# Patient Record
Sex: Female | Born: 1965 | Race: White | Hispanic: No | Marital: Married | Smoking: Never smoker
Health system: Southern US, Community
[De-identification: ages and names within clinical notes are randomized; demographics above are authoritative.]

## PROBLEM LIST (undated history)

## (undated) DIAGNOSIS — F419 Anxiety disorder, unspecified: Secondary | ICD-10-CM

## (undated) DIAGNOSIS — T7840XA Allergy, unspecified, initial encounter: Secondary | ICD-10-CM

## (undated) DIAGNOSIS — E079 Disorder of thyroid, unspecified: Secondary | ICD-10-CM

## (undated) HISTORY — DX: Anxiety disorder, unspecified: F41.9

## (undated) HISTORY — DX: Disorder of thyroid, unspecified: E07.9

## (undated) HISTORY — DX: Allergy, unspecified, initial encounter: T78.40XA

## (undated) HISTORY — PX: BREAST SURGERY: SHX581

## (undated) HISTORY — PX: OVARIAN CYST REMOVAL: SHX89

---
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
## 2014-03-26 DIAGNOSIS — J302 Other seasonal allergic rhinitis: Secondary | ICD-10-CM | POA: Insufficient documentation

## 2014-03-26 DIAGNOSIS — N959 Unspecified menopausal and perimenopausal disorder: Secondary | ICD-10-CM | POA: Insufficient documentation

## 2014-03-26 DIAGNOSIS — E039 Hypothyroidism, unspecified: Secondary | ICD-10-CM | POA: Insufficient documentation

## 2016-07-27 ENCOUNTER — Other Ambulatory Visit: Payer: Self-pay | Admitting: Family Medicine

## 2016-07-27 DIAGNOSIS — Z1231 Encounter for screening mammogram for malignant neoplasm of breast: Secondary | ICD-10-CM

## 2017-08-29 ENCOUNTER — Encounter: Payer: Self-pay | Admitting: Family Medicine

## 2017-08-29 ENCOUNTER — Ambulatory Visit: Payer: Self-pay | Admitting: Family Medicine

## 2017-08-29 ENCOUNTER — Other Ambulatory Visit: Payer: Self-pay

## 2017-08-29 VITALS — BP 120/80 | HR 76 | Temp 98.2°F | Resp 14 | Ht 65.75 in | Wt 156.4 lb

## 2017-08-29 DIAGNOSIS — F430 Acute stress reaction: Secondary | ICD-10-CM

## 2017-08-29 DIAGNOSIS — E039 Hypothyroidism, unspecified: Secondary | ICD-10-CM

## 2017-08-29 LAB — TSH: TSH: 0.11 u[IU]/mL — AB (ref 0.35–4.50)

## 2017-08-29 MED ORDER — ESCITALOPRAM OXALATE 10 MG PO TABS: 10.0000 mg | ORAL_TABLET | Freq: Every day | ORAL | 2 refills | Status: DC

## 2017-08-29 NOTE — Patient Instructions (Addendum)
Please return in 1-6 months for your complete physical with your pap smear.  It was a pleasure meeting you today! Thank you for choosing us to meet your healthcare needs! I truly look forward to working with you. If you have any questions or concerns, please send me a message via Mychart or call the office at [phone].  Consider counseling.   Depression Medications:  Taking the medicine as directed and not missing any doses is one of the best things you can do to treat your depression.  Here are some things to keep in mind:  1) Side effects (stomach upset, some increased anxiety) may happen before you notice a benefit.  These side effects typically go away over time. 2) Changes to your dose of medicine or a change in medication all together is sometimes necessary 3) Most people need to be on medication at least 6-12 months 4) Many people will notice an improvement within two weeks but the full effect of the medication can take up to 4-6 weeks 5) Stopping the medication when you start feeling better often results in a return of symptoms 6) If you start having thoughts of hurting yourself or others after starting this medicine, please call the office immediately at [phone].

## 2017-08-29 NOTE — Progress Notes (Signed)
Subjective  CC:  Chief Complaint  Patient presents with  . Establish Care    She is overdue for her Thyroid medication and labs    HPI: Jill Ferguson is a 52 y.o. female who presents to [facility] today to establish care with me as a new patient. She is a former [facility] patient and is here to reestablish care with me today.    She has the following concerns or needs:   52 year old with history of hypothyroidism overdue for thyroid check.  I reviewed labs from 2018 and prior.  We have been struggling to get her TSH goal.  She has been alternating 88 mcg with 100 mcg up until November but TSH was not checked on that level.  Since November she has been taking 100 mcg daily.  Unfortunately, she has a lot of stressors in her life.  She has 3 children, 2 of which are her biologic grandchildren for which she has custody.  A 52 year old is struggling with several issues.  She does have a daughter, their mother, who is incarcerated due to substance abuse.  She reports that she has trouble getting out of bed in the morning.  Does not want to face the day.  Like to go back to sleep.  Poor sleep with getting up several times a night to check on her kids.  Increase irritability, increased apathy, no longer having fun or socializing with others.  No history of depression or other mood disorders.  She is never been treated on medicines.  She does have perimenopausal symptoms that she thinks is contributing to her mood symptoms.  No suicidal ideations.  Depression screen [facility] 2/9 08/29/2017  Decreased Interest 0  Down, Depressed, Hopeless 0  PHQ - 2 Score 0  Altered sleeping 0  Tired, decreased energy 0  Change in appetite 0  Feeling bad or failure about yourself  0  Trouble concentrating 0  Moving slowly or fidgety/restless 0  Suicidal thoughts 0  PHQ-9 Score 0    We updated and reviewed the patient's past history in detail and it is documented below.  Patient Active  Problem List   Diagnosis Date Noted  . Acquired hypothyroidism 03/26/2014  . Postmenopausal symptoms 03/26/2014  . Seasonal allergies 03/26/2014   Health Maintenance  Topic Date Due  . MAMMOGRAM  04/03/2014  . COLONOSCOPY  08/01/2014  . PAP SMEAR  04/17/2016  . INFLUENZA VACCINE  05/01/2018 (Originally 02/01/2017)  . TETANUS/TDAP  08/29/2018 (Originally 08/02/1983)  . HIV Screening  08/29/2018 (Originally 08/02/1979)    There is no immunization history on file for this patient. Current Meds  Medication Sig  . cetirizine (ZYRTEC) 10 MG tablet Take 10 mg by mouth as needed for allergies.  Marland Kitchen levothyroxine (SYNTHROID, LEVOTHROID) 100 MCG tablet Take 1 tablet alternating with 88 mcg tablet once daily. Has been taking only 100 mcg the past month    Allergies: Patient has No Known Allergies. Past Medical History Patient  has a past medical history of Allergy, Anxiety, and Thyroid disease. Past Surgical History Patient  has a past surgical history that includes Cesarean section; Ovarian cyst removal; and Breast surgery. Family History: Patient family history includes Drug abuse in her daughter; Healthy in her son. She was adopted. Social History:  Patient  reports that  has never smoked. she has never used smokeless tobacco. She reports that she drinks alcohol. She reports that she does not use drugs.  Review of Systems: Constitutional: negative  for fever or malaise Ophthalmic: negative for photophobia, double vision or loss of vision Cardiovascular: negative for chest pain, dyspnea on exertion, or new LE swelling Respiratory: negative for SOB or persistent cough Gastrointestinal: negative for abdominal pain, change in bowel habits or melena Genitourinary: negative for dysuria or gross hematuria Musculoskeletal: negative for new gait disturbance or muscular weakness Integumentary: negative for new or persistent rashes Neurological: negative for TIA or stroke symptoms Psychiatric:  negative for SI or delusions Allergic/Immunologic: negative for hives  Patient Care Team    Relationship Specialty Notifications Start End  Willow OraAndy, Camille L, MD PCP - General Family Medicine  08/29/17     Objective  Vitals: BP 120/80   Pulse 76   Temp 98.2 F (36.8 C) (Oral)   Resp 14   Ht 5' 5.75" (1.67 m)   Wt 156 lb 6.4 oz (70.9 kg)   SpO2 97%   BMI 25.44 kg/m  General:  Well developed, well nourished, no acute distress  Psych:  Alert and oriented,normal mood and affect HEENT:  Normocephalic, atraumatic, non-icteric sclera, PERRL, oropharynx is without mass or exudate, supple neck without adenopathy, mass or thyromegaly Cardiovascular:  RRR without gallop, rub or murmur, nondisplaced PMI Respiratory:  Good breath sounds bilaterally, CTAB with normal respiratory effort Neurologic:    Mental status is normal.  No tremor Assessment  1. Acquired hypothyroidism   2. Stress reaction      Plan   Recheck thyroid levels and adjust medications as needed.  Stress reaction/depression: Extensive counseling and education given.  Recommend seeing a therapist and starting Lexapro 10 daily recheck 6 weeks  Follow up: 6 weeks for follow-up  Commons side effects, risks, benefits, and alternatives for medications and treatment plan prescribed today were discussed, and the patient expressed understanding of the given instructions. Patient is instructed to call or message via MyChart if he/she has any questions or concerns regarding our treatment plan. No barriers to understanding were identified. We discussed Red Flag symptoms and signs in detail. Patient expressed understanding regarding what to do in case of urgent or emergency type symptoms.   Medication list was reconciled, printed and provided to the patient in AVS. Patient instructions and summary information was reviewed with the patient as documented in the AVS. This note was prepared with assistance of Dragon voice recognition software.  Occasional wrong-word or sound-a-like substitutions may have occurred due to the inherent limitations of voice recognition software  Orders Placed This Encounter  Procedures  . TSH   Meds ordered this encounter  Medications  . escitalopram (LEXAPRO) 10 MG tablet    Sig: Take 1 tablet (10 mg total) by mouth daily.    Dispense:  30 tablet    Refill:  2

## 2017-08-30 ENCOUNTER — Telehealth: Payer: Self-pay | Admitting: Family Medicine

## 2017-08-30 ENCOUNTER — Other Ambulatory Visit: Payer: Self-pay | Admitting: Family Medicine

## 2017-08-30 MED ORDER — LEVOTHYROXINE SODIUM 100 MCG PO TABS: ORAL_TABLET | ORAL | 0 refills | Status: DC

## 2017-08-30 MED ORDER — LEVOTHYROXINE SODIUM 88 MCG PO TABS: ORAL_TABLET | ORAL | 0 refills | Status: DC

## 2017-08-30 NOTE — Telephone Encounter (Signed)
Copied from CRM [phone]. Topic: General - Other >> Aug 30, 2017  1:15 PM Stephannie LiSimmons, Bettylou Frew L, NT wrote: Reason for CRM: The pharmacy tod the patient they are waiting for Dr Mardelle MatteAndy to approve the medication change for  levothyroxine (SYNTHROID, LEVOTHROID) 88 MCG tablet , they are  having to use a different manufacture for this medication .please send to [facility] [address] - [place], [place] - [id number] [phone] (Phone) [phone] (Fax)

## 2017-08-30 NOTE — Telephone Encounter (Signed)
Spoke with pharmacy, advised okay to different brand, per Dr. Mardelle MatteAndy. AP, LPN

## 2017-08-30 NOTE — Telephone Encounter (Signed)
I sent in an order this am. Looks like now they need approval to change to different brand which is fine with me. I don't have that request??? Can call them to ok.

## 2017-08-30 NOTE — Telephone Encounter (Signed)
Dr. Mardelle MatteAndy,   Please see CRM, regarding medication change. Did you send this to the pharmacy? Or should I send. Please advise

## 2017-10-04 ENCOUNTER — Encounter: Payer: Self-pay | Admitting: Family Medicine

## 2017-10-04 ENCOUNTER — Ambulatory Visit: Payer: Self-pay | Admitting: Family Medicine

## 2017-10-04 ENCOUNTER — Other Ambulatory Visit: Payer: Self-pay

## 2017-10-04 ENCOUNTER — Other Ambulatory Visit (HOSPITAL_COMMUNITY)
Admission: RE | Admit: 2017-10-04 | Discharge: 2017-10-04 | Disposition: A | Discharge status: Home / Self Care | Payer: Self-pay | Source: Ambulatory Visit | Attending: Family Medicine | Admitting: Family Medicine

## 2017-10-04 VITALS — BP 104/70 | HR 65 | Temp 98.3°F | Ht 65.75 in | Wt 155.8 lb

## 2017-10-04 DIAGNOSIS — Z0000 Encounter for general adult medical examination without abnormal findings: Secondary | ICD-10-CM

## 2017-10-04 DIAGNOSIS — Z124 Encounter for screening for malignant neoplasm of cervix: Secondary | ICD-10-CM | POA: Insufficient documentation

## 2017-10-04 DIAGNOSIS — F430 Acute stress reaction: Secondary | ICD-10-CM

## 2017-10-04 DIAGNOSIS — Z1231 Encounter for screening mammogram for malignant neoplasm of breast: Secondary | ICD-10-CM

## 2017-10-04 DIAGNOSIS — Z1239 Encounter for other screening for malignant neoplasm of breast: Secondary | ICD-10-CM

## 2017-10-04 DIAGNOSIS — E039 Hypothyroidism, unspecified: Secondary | ICD-10-CM

## 2017-10-04 LAB — CBC WITH DIFFERENTIAL/PLATELET
Basophils Absolute: 0 10*3/uL (ref 0.0–0.1)
Basophils Relative: 1.1 % (ref 0.0–3.0)
EOS PCT: 5.2 % — AB (ref 0.0–5.0)
Eosinophils Absolute: 0.2 10*3/uL (ref 0.0–0.7)
HCT: 38.3 % (ref 36.0–46.0)
HEMOGLOBIN: 13.1 g/dL (ref 12.0–15.0)
Lymphocytes Relative: 37.7 % (ref 12.0–46.0)
Lymphs Abs: 1.6 10*3/uL (ref 0.7–4.0)
MCHC: 34.2 g/dL (ref 30.0–36.0)
MCV: 93.4 fl (ref 78.0–100.0)
MONOS PCT: 9.7 % (ref 3.0–12.0)
Monocytes Absolute: 0.4 10*3/uL (ref 0.1–1.0)
Neutro Abs: 1.9 10*3/uL (ref 1.4–7.7)
Neutrophils Relative %: 46.3 % (ref 43.0–77.0)
Platelets: 207 10*3/uL (ref 150.0–400.0)
RBC: 4.1 Mil/uL (ref 3.87–5.11)
RDW: 12 % (ref 11.5–15.5)
WBC: 4.2 10*3/uL (ref 4.0–10.5)

## 2017-10-04 LAB — LIPID PANEL
CHOLESTEROL: 208 mg/dL — AB (ref 0–200)
HDL: 72.8 mg/dL (ref >39.00)
LDL Cholesterol: 121 mg/dL — ABNORMAL HIGH (ref 0–99)
NonHDL: 134.96
Total CHOL/HDL Ratio: 3
Triglycerides: 71 mg/dL (ref 0.0–149.0)
VLDL: 14.2 mg/dL (ref 0.0–40.0)

## 2017-10-04 LAB — COMPREHENSIVE METABOLIC PANEL
ALBUMIN: 4.2 g/dL (ref 3.5–5.2)
ALK PHOS: 45 U/L (ref 39–117)
ALT: 13 U/L (ref 0–35)
AST: 16 U/L (ref 0–37)
BUN: 14 mg/dL (ref 6–23)
CALCIUM: 10 mg/dL (ref 8.4–10.5)
CO2: 30 mEq/L (ref 19–32)
Chloride: 102 mEq/L (ref 96–112)
Creatinine, Ser: 0.64 mg/dL (ref 0.40–1.20)
GFR: 103.1 mL/min (ref >60.00)
Glucose, Bld: 93 mg/dL (ref 70–99)
POTASSIUM: 4.4 meq/L (ref 3.5–5.1)
Sodium: 139 mEq/L (ref 135–145)
TOTAL PROTEIN: 7.1 g/dL (ref 6.0–8.3)
Total Bilirubin: 0.6 mg/dL (ref 0.2–1.2)

## 2017-10-04 LAB — TSH: TSH: 0.19 u[IU]/mL — ABNORMAL LOW (ref 0.35–4.50)

## 2017-10-04 MED ORDER — ESCITALOPRAM OXALATE 10 MG PO TABS: 10.0000 mg | ORAL_TABLET | Freq: Every day | ORAL | 3 refills | Status: DC

## 2017-10-04 NOTE — Patient Instructions (Signed)
Please return in 3 months to recheck your mood and thyroid levels.   If you have any questions or concerns, please don't hesitate to send me a message via MyChart or call the office at [phone]. Thank you for visiting with us today! It's our pleasure caring for you.  Please do these things to maintain good health!   Exercise at least 30-45 minutes a day,  4-5 days a week.   Eat a low-fat diet with lots of fruits and vegetables, up to 7-9 servings per day.  Drink plenty of water daily. Try to drink 8 8oz glasses per day.  Seatbelts can save your life. Always wear your seatbelt.  Place Smoke Detectors on every level of your home and check batteries every year.  Schedule an appointment with an eye doctor for an eye exam every 1-2 years  Safe sex - use condoms to protect yourself from STDs if you could be exposed to these types of infections. Use birth control if you do not want to become pregnant and are sexually active.  Avoid heavy alcohol use. If you drink, keep it to less than 2 drinks/day and not every day.  Health Care Power of Attorney.  Choose someone you trust that could speak for you if you became unable to speak for yourself.  Depression is common in our stressful world.If you're feeling down or losing interest in things you normally enjoy, please come in for a visit.  If anyone is threatening or hurting you, please get help. Physical or Emotional Violence is never OK.

## 2017-10-04 NOTE — Progress Notes (Signed)
Subjective  Chief Complaint  Patient presents with  . Annual Exam    no complaints, pap today, needs mammo and colonoscopy     HPI: Jill Ferguson is a 52 y.o. female who presents to [facility] today for a Female Wellness Visit. She also has the concerns and/or needs as listed above in the chief complaint. These will be addressed in addition to the Health Maintenance Visit.   Wellness Visit: annual visit with health maintenance review and exam with Pap  HM: due for mammo and crc screen and pap. Has cologuard kit at home. Lifestyle: Body mass index is 25.34 kg/m. Wt Readings from Last 3 Encounters:  10/04/17 155 lb 12.8 oz (70.7 kg)  08/29/17 156 lb 6.4 oz (70.9 kg)   Diet: low fat Exercise: intermittently,   Chronic disease management visit and/or acute problem visit:   Hypothyroidism f/u: Jill Ferguson is a 52 y.o. female who presents for follow up of hypothyroidism. Last TSH showed control was inadequate - THS was low and thyroid supplement medication was adjusted accordingly.  Current symptoms: none she is feeling better on lower dose. Patient denies change in energy level, diarrhea, heat / cold intolerance, nervousness, palpitations and weight changes. Symptoms have stabilized.She has been compliant with the medication. Due for lab recheck - 4-5 weeks since change.   F/u stress reaction: started meds 6 weeks ago: see last note. Doing much better. Feeling better able to cope with stressors, less apathetic and more engaged with family and friends. No AEs from lexapro.   Patient Active Problem List   Diagnosis Date Noted  . Acquired hypothyroidism 03/26/2014  . Postmenopausal symptoms 03/26/2014  . Seasonal allergies 03/26/2014   Health Maintenance  Topic Date Due  . MAMMOGRAM  04/03/2014  . Fecal DNA (Cologuard)  08/01/2014  . PAP SMEAR  04/17/2016  . INFLUENZA VACCINE  05/01/2018 (Originally 02/01/2018)  . TETANUS/TDAP  08/29/2018  (Originally 08/02/1983)  . HIV Screening  08/29/2018 (Originally 08/02/1979)    There is no immunization history on file for this patient. We updated and reviewed the patient's past history in detail and it is documented below. Allergies: Patient has No Known Allergies. Past Medical History Patient  has a past medical history of Allergy, Anxiety, and Thyroid disease. Past Surgical History Patient  has a past surgical history that includes Cesarean section; Ovarian cyst removal; and Breast surgery. Family History: Patient family history includes Drug abuse in her daughter; Healthy in her son. She was adopted. Social History:  Patient  reports that she has never smoked. She has never used smokeless tobacco. She reports that she drinks alcohol. She reports that she does not use drugs.  Review of Systems: Constitutional: negative for fever or malaise Ophthalmic: negative for photophobia, double vision or loss of vision Cardiovascular: negative for chest pain, dyspnea on exertion, or new LE swelling Respiratory: negative for SOB or persistent cough Gastrointestinal: negative for abdominal pain, change in bowel habits or melena Genitourinary: negative for dysuria or gross hematuria, no abnormal uterine bleeding or disharge Musculoskeletal: negative for new gait disturbance or muscular weakness Integumentary: negative for new or persistent rashes, no breast lumps Neurological: negative for TIA or stroke symptoms Psychiatric: negative for SI or delusions Allergic/Immunologic: negative for hives  Patient Care Team    Relationship Specialty Notifications Start End  Leamon Arnt, MD PCP - General Family Medicine  08/29/17     Objective  Vitals: BP 104/70   Pulse 65   Temp  98.3 F (36.8 C)   Ht 5' 5.75" (1.67 m)   Wt 155 lb 12.8 oz (70.7 kg)   BMI 25.34 kg/m  General:  Well developed, well nourished, no acute distress  Psych:  Alert and orientedx3,normal mood and affect, appears  happier today HEENT:  Normocephalic, atraumatic, non-icteric sclera, PERRL, oropharynx is clear without mass or exudate, supple neck without adenopathy, mass or thyromegaly Cardiovascular:  Normal S1, S2, RRR without gallop, rub or murmur, nondisplaced PMI Respiratory:  Good breath sounds bilaterally, CTAB with normal respiratory effort Gastrointestinal: normal bowel sounds, soft, non-tender, no noted masses. No HSM MSK: no deformities, contusions. Joints are without erythema or swelling. Spine and CVA region are nontender Skin:  Warm, no rashes or suspicious lesions noted Neurologic:    Mental status is normal. CN 2-11 are normal. Gross motor and sensory exams are normal. Normal gait. No tremor Breast Exam: bilateral breast implants, No mass, skin retraction or nipple discharge is appreciated in either breast. No axillary adenopathy. Fibrocystic changes are not noted Pelvic Exam: Normal external genitalia, no vulvar or vaginal lesions present. Thin discharge present. Clear, stenotic cervix w/o CMT. Bimanual exam reveals a nontender fundus w/o masses, nl size. No adnexal masses present. No inguinal adenopathy. A PAP smear was performed.   Assessment  1. Annual physical exam   2. Acquired hypothyroidism   3. Stress reaction   4. Cervical cancer screening   5. Breast cancer screening      Plan  Female Wellness Visit:  Age appropriate Health Maintenance and Prevention measures were discussed with patient. Included topics are cancer screening recommendations, ways to keep healthy (see AVS) including dietary and exercise recommendations, regular eye and dental care, use of seat belts, and avoidance of moderate alcohol use and tobacco use. cologuard for CRC screen; pap and hpv done. Mammogram to be scheduled.   BMI: discussed patient's BMI and encouraged positive lifestyle modifications to help get to or maintain a target BMI.  HM needs and immunizations were addressed and ordered. See below for  orders. See HM and immunization section for updates.  Routine labs and screening tests ordered including cmp, cbc and lipids where appropriate.  Discussed recommendations regarding Vit D and calcium supplementation (see AVS)  Chronic disease f/u and/or acute problem visit: (deemed necessary to be done in addition to the wellness visit):  Stress reaction: much improved on lexapro. Continue and recheck in 3 mtnhs  Hypothyroid - check today on lower dose supplements. Clinically improved. Recheck again in 3 months.  Follow up: Return in about 3 months (around 01/03/2018) for mood follow up, hypothyroidism follow up.   Commons side effects, risks, benefits, and alternatives for medications and treatment plan prescribed today were discussed, and the patient expressed understanding of the given instructions. Patient is instructed to call or message via MyChart if he/she has any questions or concerns regarding our treatment plan. No barriers to understanding were identified. We discussed Red Flag symptoms and signs in detail. Patient expressed understanding regarding what to do in case of urgent or emergency type symptoms.   Medication list was reconciled, printed and provided to the patient in AVS. Patient instructions and summary information was reviewed with the patient as documented in the AVS. This note was prepared with assistance of Dragon voice recognition software. Occasional wrong-word or sound-a-like substitutions may have occurred due to the inherent limitations of voice recognition software  Orders Placed This Encounter  Procedures  . MM DIGITAL SCREENING BILATERAL  . CBC with Differential/Platelet  .  Comprehensive metabolic panel  . Lipid panel  . HIV antibody  . TSH   Meds ordered this encounter  Medications  . escitalopram (LEXAPRO) 10 MG tablet    Sig: Take 1 tablet (10 mg total) by mouth daily.    Dispense:  90 tablet    Refill:  3

## 2017-10-05 LAB — HIV ANTIBODY (ROUTINE TESTING W REFLEX): HIV 1&2 Ab, 4th Generation: NONREACTIVE

## 2017-10-05 LAB — CYTOLOGY - PAP
ADEQUACY: ABSENT
DIAGNOSIS: NEGATIVE
HPV (WINDOPATH): NOT DETECTED

## 2017-10-23 ENCOUNTER — Telehealth: Payer: Self-pay | Admitting: Family Medicine

## 2017-10-23 ENCOUNTER — Other Ambulatory Visit: Payer: Self-pay | Admitting: Family Medicine

## 2017-10-23 NOTE — Telephone Encounter (Signed)
Called patient and was not able to leave a voice message due to her mail box being full.   OK for PEC/Triage Nurse to go over lab results with patient for Synthroid instructions.  Notes from Dr. Mardelle MatteAndy in result notes: Mychart note sent to patient with instructions. All lab work looks good except thyroid remains a little overtreated. Please ask her to take her synthroid 100mcg MWF and 88mcg all other days of the week (T,Th, Sa,Su). We will recheck levels on this regimen in 3 months at her f/u visit.   Next OV is on 12/27/2017 at 8:15am.  CRM Created.

## 2017-10-23 NOTE — Telephone Encounter (Signed)
Copied from CRM [id number]. Topic: Quick Communication - See Telephone Encounter >> Oct 23, 2017 10:40 AM Clack, Princella PellegriniJessica D wrote: CRM for notification. See Telephone encounter for: 10/23/17.  Pt calling back about her labs. She states Dr. Mardelle MatteAndy sent a MyChart message that stated a nurse would give her a call to go over her thyroid medication. The date on the note from labs is 4/4, pt states no one has called her. Please f/u with pt.

## 2017-10-23 NOTE — Telephone Encounter (Signed)
Pt  Advised of Result  Notes  By Dr Mardelle MatteAndy  Results  Read  Word by  Word  For  Synthroid  Instructions  And  followup  Pt verbalizes  Knowledge

## 2017-10-23 NOTE — Telephone Encounter (Signed)
Please see message. °

## 2017-10-23 NOTE — Telephone Encounter (Signed)
Refilled meds: due for recheck in 3 months

## 2017-10-24 LAB — HM MAMMOGRAPHY

## 2017-10-31 ENCOUNTER — Encounter: Payer: Self-pay | Admitting: Family Medicine

## 2017-11-01 ENCOUNTER — Telehealth: Payer: Self-pay | Admitting: Family Medicine

## 2017-11-01 NOTE — Telephone Encounter (Signed)
Received orders from [facility], via fax. Requesting the order be reviewed, signed and faxed back. Pt is already scheduled for 11/03/17. Placed in front bin with charge sheet.

## 2017-11-01 NOTE — Telephone Encounter (Signed)
No charge for [employer].   Kathi Simpers,  LPN

## 2017-11-03 ENCOUNTER — Encounter: Payer: Self-pay | Admitting: Family Medicine

## 2017-11-03 ENCOUNTER — Encounter: Payer: Self-pay | Admitting: Emergency Medicine

## 2017-11-03 LAB — HM MAMMOGRAPHY

## 2017-11-03 NOTE — Progress Notes (Signed)
Probable benign finding. Recheck mammogram right breast in 6 months.

## 2017-12-27 ENCOUNTER — Other Ambulatory Visit: Payer: Self-pay

## 2017-12-27 ENCOUNTER — Encounter: Payer: Self-pay | Admitting: Family Medicine

## 2017-12-27 ENCOUNTER — Ambulatory Visit: Payer: Self-pay | Admitting: Family Medicine

## 2017-12-27 VITALS — BP 120/82 | HR 65 | Temp 97.7°F | Resp 16 | Ht 65.75 in | Wt 163.8 lb

## 2017-12-27 DIAGNOSIS — E039 Hypothyroidism, unspecified: Secondary | ICD-10-CM

## 2017-12-27 DIAGNOSIS — F430 Acute stress reaction: Secondary | ICD-10-CM

## 2017-12-27 LAB — TSH: TSH: 1.05 u[IU]/mL (ref 0.35–4.50)

## 2017-12-27 NOTE — Progress Notes (Signed)
Subjective  CC:  Chief Complaint  Patient presents with  . Hypothyroidism    Doing well  . Anxiety    HPI: Jill Ferguson is a 52 y.o. female who presents to the office today to address the problems listed above in the chief complaint.  Stress reaction: 2683-month follow-up for stress reaction, having been on Lexapro 10 mg daily.  Doing much better.  Definitely has helped manage her stress levels.  Irritability is decreased.  Less anxious.  He has noted mild decrease in ability to remote but she denies being apathetic at this time.  No adverse effects.  She has noted some weight gain.  Partially related to medicines and/or related to adjusting thyroid medicines down.  Hypothyroidism: Taking 88 and 100mcg per instructions to try to get to goal. Symptoms: pt feels well w/o sxs of low or high thyroid. She is taking her medications daily as instructed.   Lab Results  Component Value Date   TSH 0.19 (L) 10/04/2017    Assessment  1. Stress reaction   2. Acquired hypothyroidism      Plan   Stress reaction: Much improved on Lexapro.  Counseling done to monitor for effects of efficacy and for overtreatment.  Continue medications for the next 3 to 9 months.  Reevaluate at 6 months.  Patient to return if she feels that she is experiencing side effects or apathy. Hypothyroidism: Recheck today.  Hopefully levels will be at goal with adjusted dosage. Recommendations for proper thyroid replacement: - in am - fasting - at least 30 min from b'fast - no PPIs, Ca, Fe, MVI within 4 hours   Follow up: 6 months for mood follow-up  Orders Placed This Encounter  Procedures  . TSH   No orders of the defined types were placed in this encounter.     I reviewed the patients updated PMH, FH, and SocHx.    Patient Active Problem List   Diagnosis Date Noted  . Acquired hypothyroidism 03/26/2014  . Postmenopausal symptoms 03/26/2014  . Seasonal allergies 03/26/2014   Current Meds  Medication  Sig  . cetirizine (ZYRTEC) 10 MG tablet Take 10 mg by mouth as needed for allergies.  Marland Kitchen. escitalopram (LEXAPRO) 10 MG tablet Take 1 tablet (10 mg total) by mouth daily.  Marland Kitchen. levothyroxine (SYNTHROID, LEVOTHROID) 100 MCG tablet Take 1 tablet (100 mcg total) by mouth every Monday, Wednesday, and Friday.  . levothyroxine (SYNTHROID, LEVOTHROID) 88 MCG tablet Take 1 tablet (88 mcg total) by mouth every Tuesday, Thursday, Saturday, and Sunday.    Allergies: Patient has No Known Allergies. Family History: Patient family history includes Drug abuse in her daughter; Healthy in her son. She was adopted. Social History:  Patient  reports that she has never smoked. She has never used smokeless tobacco. She reports that she drinks alcohol. She reports that she does not use drugs.  Review of Systems: Constitutional: Negative for fever malaise or anorexia Cardiovascular: negative for chest pain Respiratory: negative for SOB or persistent cough Gastrointestinal: negative for abdominal pain Wt Readings from Last 3 Encounters:  12/27/17 163 lb 12.8 oz (74.3 kg)  10/04/17 155 lb 12.8 oz (70.7 kg)  08/29/17 156 lb 6.4 oz (70.9 kg)    Objective  Vitals: Ht 5' 5.75" (1.67 m)   Wt 163 lb 12.8 oz (74.3 kg)   BMI 26.64 kg/m  General: no acute distress , A&Ox3 HEENT: PEERL, conjunctiva normal, Oropharynx moist,neck is supple Cardiovascular:  RRR without murmur or gallop.  Respiratory:  Good breath sounds bilaterally, CTAB with normal respiratory effort Skin:  Warm, no rashes Neuro: No tremor Psych: Normal affect     Commons side effects, risks, benefits, and alternatives for medications and treatment plan prescribed today were discussed, and the patient expressed understanding of the given instructions. Patient is instructed to call or message via MyChart if he/she has any questions or concerns regarding our treatment plan. No barriers to understanding were identified. We discussed Red Flag symptoms and  signs in detail. Patient expressed understanding regarding what to do in case of urgent or emergency type symptoms.   Medication list was reconciled, printed and provided to the patient in AVS. Patient instructions and summary information was reviewed with the patient as documented in the AVS. This note was prepared with assistance of Dragon voice recognition software. Occasional wrong-word or sound-a-like substitutions may have occurred due to the inherent limitations of voice recognition software

## 2017-12-27 NOTE — Patient Instructions (Signed)
Please return in 6 months for mood follow up.  I will release your lab results to you on your MyChart account with further instructions. Please reply with any questions.    If you have any questions or concerns, please don't hesitate to send me a message via MyChart or call the office at [phone]. Thank you for visiting with Jill Ferguson today! It's our pleasure caring for you.  Recommendations for proper thyroid replacement: Take daily:  - in am - fasting - at least 30 min from breakfast - no Calcium, iron, Multivitamin or omeprazole (PPI's) within 4 hours

## 2018-04-22 ENCOUNTER — Other Ambulatory Visit: Payer: Self-pay | Admitting: Family Medicine

## 2018-04-23 ENCOUNTER — Other Ambulatory Visit: Payer: Self-pay | Admitting: Family Medicine

## 2018-04-23 MED ORDER — LEVOTHYROXINE SODIUM 100 MCG PO TABS: 100.0000 ug | ORAL_TABLET | ORAL | 1 refills | Status: DC

## 2018-04-23 MED ORDER — LEVOTHYROXINE SODIUM 88 MCG PO TABS: 88.0000 ug | ORAL_TABLET | ORAL | 1 refills | Status: DC

## 2018-04-23 NOTE — Telephone Encounter (Signed)
Refill is appropriate based on last results.    Medication has been sent to pharmacy.

## 2018-04-23 NOTE — Telephone Encounter (Signed)
Copied from CRM [id number]. Topic: Quick Communication - Rx Refill/Question >> Apr 23, 2018 12:30 PM Burchel, Abbi R wrote: Medication: levothyroxine (SYNTHROID, LEVOTHROID) 100 MCG tablet   levothyroxine (SYNTHROID, LEVOTHROID) 88 MCG tablet    Preferred Pharmacy : [facility] [address] [place], [place] - [address] [place] [address] Phone: [phone] Fax: [phone]   Pt was advised that RX refills may take up to 3 business days. We ask that you follow-up with your pharmacy.

## 2018-05-10 ENCOUNTER — Encounter: Payer: Self-pay | Admitting: Family Medicine

## 2018-05-10 LAB — HM MAMMOGRAPHY: HM MAMMO: ABNORMAL — AB (ref 0–4)

## 2018-05-11 ENCOUNTER — Encounter: Payer: Self-pay | Admitting: Emergency Medicine

## 2018-06-11 ENCOUNTER — Encounter: Payer: Self-pay | Admitting: Family Medicine

## 2018-06-11 ENCOUNTER — Other Ambulatory Visit: Payer: Self-pay

## 2018-06-11 ENCOUNTER — Ambulatory Visit (INDEPENDENT_AMBULATORY_CARE_PROVIDER_SITE_OTHER): Payer: 59 | Admitting: Family Medicine

## 2018-06-11 ENCOUNTER — Telehealth: Payer: Self-pay | Admitting: Family Medicine

## 2018-06-11 VITALS — BP 138/80 | HR 75 | Temp 98.0°F | Ht 65.75 in | Wt 170.6 lb

## 2018-06-11 DIAGNOSIS — F430 Acute stress reaction: Secondary | ICD-10-CM

## 2018-06-11 DIAGNOSIS — E039 Hypothyroidism, unspecified: Secondary | ICD-10-CM

## 2018-06-11 LAB — TSH: TSH: 8.27 u[IU]/mL — AB (ref 0.35–4.50)

## 2018-06-11 MED ORDER — LEVOTHYROXINE SODIUM 100 MCG PO TABS: 100.0000 ug | ORAL_TABLET | Freq: Every day | ORAL | 1 refills | Status: DC

## 2018-06-11 MED ORDER — LEVOTHYROXINE SODIUM 88 MCG PO TABS: 88.0000 ug | ORAL_TABLET | Freq: Every day | ORAL | 1 refills | Status: DC

## 2018-06-11 NOTE — Telephone Encounter (Signed)
-----   Message from Dene GentryAmy M Peterman, LPN sent at 16/1/096012/03/2018  3:07 PM EST ----- Spoke with Patient regarding lab results, discussed Provider recommendations. Advised to call back with questions or concerns.   Patient states that she did start Serovital about 5 weeks ago, however she takes at night. Otherwise she states that she is strict with taking her medication. She alternates the 88 and 100. She states one morning she couldn't remember if she took it and may have taken medication twice. Otherwise she is strict.                                                               Patient verbalized understanding  Kathi SimpersAmy Peterman, LPN

## 2018-06-11 NOTE — Addendum Note (Signed)
Addended byDene Gentry: Tylique Aull M on: 06/11/2018 05:03 PM   Modules accepted: Orders

## 2018-06-11 NOTE — Patient Instructions (Addendum)
Please return in April 2020 for your annual complete physical; please come fasting.  Please complete and send in the Cologuard test for your colon cancer screening.  I will release your lab results to you on your MyChart account with further instructions. Please reply with any questions.   If you have any questions or concerns, please don't hesitate to send me a message via MyChart or call the office at [phone]. Thank you for visiting with us today! It's our pleasure caring for you.

## 2018-06-11 NOTE — Telephone Encounter (Signed)
Patient informed of Dose Change.   Sig Updated.

## 2018-06-11 NOTE — Progress Notes (Signed)
Subjective  CC:  Chief Complaint  Patient presents with  . Anxiety    doing well, taking lexapro     HPI: Jill Ferguson is a 52 y.o. female who presents to the office today to address the problems listed above in the chief complaint, mood problems.  Has been on Lexapro since June.  Continues to feel that it is helpful while she remains stressed at home.  Unfortunately, she feels it has contributed to weight gain.  She does not overeat and tends to eat healthy.  Limited exercise but she keeps active all day long caring for a large family.  She does have a treadmill and is recently starting to use it..  She denies other adverse effects from the medication. Depression screen [facility] 2/9 06/11/2018 10/04/2017 08/29/2017  Decreased Interest 1 0 0  Down, Depressed, Hopeless 0 0 0  PHQ - 2 Score 1 0 0  Altered sleeping 1 0 0  Tired, decreased energy 1 0 0  Change in appetite 0 0 0  Feeling bad or failure about yourself  1 0 0  Trouble concentrating 1 0 0  Moving slowly or fidgety/restless 0 0 0  Suicidal thoughts 0 0 0  PHQ-9 Score 5 0 0  Difficult doing work/chores - Not difficult at all -   GAD 7 : Generalized Anxiety Score 06/11/2018  Nervous, Anxious, on Edge 1  Control/stop worrying 2  Worry too much - different things 2  Trouble relaxing 2  Restless 0  Easily annoyed or irritable 1  Afraid - awful might happen 0  Total GAD 7 Score 8      Assessment  1. Stress reaction   2. Acquired hypothyroidism      Plan   Depression/anxiety: Improved and maintained on Lexapro.  We discussed several different options of treatment.  At this point, continue Lexapro, increase aerobic and weight training activity and recheck in April.  If weight increases, in spite of better food choices and increase activity, then we will consider changing to a different medication.  Patient understands and agrees with care plan.  Recheck TSH to ensure weight gain is not related to hypothyroidism.  She is  compliant with her medications.  Reviewed concept of mood problems caused by biochemical imbalance of neurotransmitters and rationale for treatment with medications and therapy.   Counseling given: pt was instructed to contact office, on-call physician or crisis Hotline if symptoms worsen significantly. If patient develops any suicidal or homicidal thoughts, she is directed to the ER immediately.   Follow up: Return in about 4 months (around 10/11/2018) for complete physical.  Orders Placed This Encounter  Procedures  . TSH   No orders of the defined types were placed in this encounter.     I reviewed the patients updated PMH, FH, and SocHx.    Patient Active Problem List   Diagnosis Date Noted  . Acquired hypothyroidism 03/26/2014  . Postmenopausal symptoms 03/26/2014  . Seasonal allergies 03/26/2014   Current Meds  Medication Sig  . escitalopram (LEXAPRO) 10 MG tablet Take 1 tablet (10 mg total) by mouth daily.  Marland Kitchen levothyroxine (SYNTHROID, LEVOTHROID) 100 MCG tablet TAKE ONE TABLET BY MOUTH ON MONDAY, WEDNESDAY AND FRIDAY.  Marland Kitchen levothyroxine (SYNTHROID, LEVOTHROID) 88 MCG tablet TAKE ONE TABLET BY MOUTH EVERY TUESDAY, THURSDAY, SATURDAY AND SUNDAY.    Wt Readings from Last 3 Encounters:  06/11/18 170 lb 9.6 oz (77.4 kg)  12/27/17 163 lb 12.8 oz (74.3 kg)  10/04/17 155 lb  12.8 oz (70.7 kg)    Allergies: Patient has No Known Allergies. Family history:  Patient family history includes Drug abuse in her daughter; Healthy in her son. She was adopted. Social History   Socioeconomic History  . Marital status: Married    Spouse name: Not on file  . Number of children: 3  . Years of education: Not on file  . Highest education level: Not on file  Occupational History  . Occupation: homemaker  Social Needs  . Financial resource strain: Not on file  . Food insecurity:    Worry: Not on file    Inability: Not on file  . Transportation needs:    Medical: Not on file     Non-medical: Not on file  Tobacco Use  . Smoking status: Never Smoker  . Smokeless tobacco: Never Used  Substance and Sexual Activity  . Alcohol use: Yes    Comment: Occasional  . Drug use: No  . Sexual activity: Yes    Birth control/protection: Post-menopausal  Lifestyle  . Physical activity:    Days per week: Not on file    Minutes per session: Not on file  . Stress: Not on file  Relationships  . Social connections:    Talks on phone: Not on file    Gets together: Not on file    Attends religious service: Not on file    Active member of club or organization: Not on file    Attends meetings of clubs or organizations: Not on file    Relationship status: Not on file  Other Topics Concern  . Not on file  Social History Narrative  . Not on file     Review of Systems: Constitutional: Negative for fever malaise or anorexia Cardiovascular: negative for chest pain Respiratory: negative for SOB or persistent cough Gastrointestinal: negative for abdominal pain  Objective  Vitals: BP 138/80   Pulse 75   Temp 98 F (36.7 C)   Ht 5' 5.75" (1.67 m)   Wt 170 lb 9.6 oz (77.4 kg)   SpO2 96%   BMI 27.75 kg/m  General: no acute distress, well appearing, no apparent distress, well groomed Psych:  Alert and oriented x 3,normal mood, behavior, speech, dress, and thought processes.     Commons side effects, risks, benefits, and alternatives for medications and treatment plan prescribed today were discussed, and the patient expressed understanding of the given instructions. Patient is instructed to call or message via MyChart if he/she has any questions or concerns regarding our treatment plan. No barriers to understanding were identified. We discussed Red Flag symptoms and signs in detail. Patient expressed understanding regarding what to do in case of urgent or emergency type symptoms.   Medication list was reconciled, printed and provided to the patient in AVS. Patient instructions  and summary information was reviewed with the patient as documented in the AVS. This note was prepared with assistance of Dragon voice recognition software. Occasional wrong-word or sound-a-like substitutions may have occurred due to the inherent limitations of voice recognition software

## 2018-06-11 NOTE — Telephone Encounter (Signed)
Reviewed labs from the last year and her dosages.   Please ask her to start taking: Levothyroxine 100mcg Tu, Th, Sat and Sun And the 88mcg dose on M, W, F  (this is the reverse of how she is taking it now; to increase the total dose from 652mcg weekly to 664mcg weekly)  We will recheck in 6-8 weeks.  Please change sig on current meds as well.   Thank you.

## 2018-08-13 ENCOUNTER — Encounter: Payer: Self-pay | Admitting: Family Medicine

## 2018-08-13 ENCOUNTER — Other Ambulatory Visit: Payer: Self-pay

## 2018-08-13 ENCOUNTER — Ambulatory Visit (INDEPENDENT_AMBULATORY_CARE_PROVIDER_SITE_OTHER): Payer: 59 | Admitting: Family Medicine

## 2018-08-13 VITALS — BP 124/76 | HR 71 | Temp 97.9°F | Resp 14 | Ht 66.0 in | Wt 168.0 lb

## 2018-08-13 DIAGNOSIS — E039 Hypothyroidism, unspecified: Secondary | ICD-10-CM

## 2018-08-13 LAB — TSH: TSH: 0.31 u[IU]/mL — ABNORMAL LOW (ref 0.35–4.50)

## 2018-08-13 NOTE — Patient Instructions (Signed)
Please follow up as scheduled for your next visit with me: 10/15/2018 for your physical.  I will release your lab results to you on your MyChart account with further instructions. Please reply with any questions.    If you have any questions or concerns, please don't hesitate to send me a message via MyChart or call the office at [phone]. Thank you for visiting with [place] today! It's our pleasure caring for you.

## 2018-08-13 NOTE — Progress Notes (Signed)
Subjective  CC:  Chief Complaint  Patient presents with  . Hypothyroidism    HPI: Jill Ferguson is a 53 y.o. female who presents to the office today to address the problems listed above in the chief complaint.  Hypothyroidism f/u: Jill Ferguson is a 53 y.o. female who presents for follow up of hypothyroidism. Last TSH showed control was inadequate, and thyroid supplement medication was adjusted accordingly. We flipped the alternating dosing of 100 and 88. Current symptoms: none . Patient denies diarrhea, heat / cold intolerance, nervousness, palpitations and weight changes. Symptoms have improved; perhaps a little more energy. Weight is down a few pounds. Mood is stable. .She has been compliant with the medication. Due for lab recheck.     Assessment  1. Acquired hypothyroidism      Plan   hypothryoidism:  Recheck levels and adjust meds accordingly. Has been difficult to control. Consider endocrinology if not yet controlled.   Follow up: Return for as scheduled.  10/15/2018  Orders Placed This Encounter  Procedures  . TSH   No orders of the defined types were placed in this encounter.     I reviewed the patients updated PMH, FH, and SocHx.    Patient Active Problem List   Diagnosis Date Noted  . Acquired hypothyroidism 03/26/2014  . Postmenopausal symptoms 03/26/2014  . Seasonal allergies 03/26/2014   Current Meds  Medication Sig  . acyclovir (ZOVIRAX) 200 MG capsule TAKE 2 CAPSULES BY MOUTH ONCE DAILY AS NEEDED  . escitalopram (LEXAPRO) 10 MG tablet Take 1 tablet (10 mg total) by mouth daily.  Marland Kitchen. levothyroxine (SYNTHROID, LEVOTHROID) 100 MCG tablet Take 1 tablet (100 mcg total) by mouth daily before breakfast. Take 100 mcg T, TH, Sat, Sun  . levothyroxine (SYNTHROID, LEVOTHROID) 88 MCG tablet Take 1 tablet (88 mcg total) by mouth daily before breakfast. Take 88 mg MWF  . MELATONIN PO Take by mouth.    Allergies: Patient has No Known Allergies. Family  History: Patient family history includes Drug abuse in her daughter; Healthy in her son. She was adopted. Social History:  Patient  reports that she has never smoked. She has never used smokeless tobacco. She reports current alcohol use. She reports that she does not use drugs.  Review of Systems: Constitutional: Negative for fever malaise or anorexia Cardiovascular: negative for chest pain Respiratory: negative for SOB or persistent cough Gastrointestinal: negative for abdominal pain  Objective  Vitals: BP 124/76   Pulse 71   Temp 97.9 F (36.6 C) (Oral)   Resp 14   Ht 5\' 6"  (1.676 m)   Wt 168 lb (76.2 kg)   SpO2 98%   BMI 27.12 kg/m  General: no acute distress , A&Ox3 HEENT: PEERL, conjunctiva normal, Oropharynx moist,neck is supple Cardiovascular:  RRR without murmur or gallop.  No peripheral edema Skin:  Warm, no rashes Neuro: no tremor  Lab Results  Component Value Date   TSH 8.27 (H) 06/11/2018   Wt Readings from Last 3 Encounters:  08/13/18 168 lb (76.2 kg)  06/11/18 170 lb 9.6 oz (77.4 kg)  12/27/17 163 lb 12.8 oz (74.3 kg)      Commons side effects, risks, benefits, and alternatives for medications and treatment plan prescribed today were discussed, and the patient expressed understanding of the given instructions. Patient is instructed to call or message via MyChart if he/she has any questions or concerns regarding our treatment plan. No barriers to understanding were identified. We discussed Red Flag symptoms and signs in  detail. Patient expressed understanding regarding what to do in case of urgent or emergency type symptoms.   Medication list was reconciled, printed and provided to the patient in AVS. Patient instructions and summary information was reviewed with the patient as documented in the AVS. This note was prepared with assistance of Dragon voice recognition software. Occasional wrong-word or sound-a-like substitutions may have occurred due to the  inherent limitations of voice recognition software

## 2018-09-14 ENCOUNTER — Other Ambulatory Visit: Payer: Self-pay | Admitting: Family Medicine

## 2018-10-02 ENCOUNTER — Ambulatory Visit (INDEPENDENT_AMBULATORY_CARE_PROVIDER_SITE_OTHER): Payer: 59 | Admitting: Family Medicine

## 2018-10-02 ENCOUNTER — Other Ambulatory Visit: Payer: Self-pay

## 2018-10-02 ENCOUNTER — Encounter: Payer: Self-pay | Admitting: Family Medicine

## 2018-10-02 VITALS — BP 128/78 | HR 80 | Temp 98.1°F | Resp 16 | Ht 65.75 in | Wt 168.8 lb

## 2018-10-02 DIAGNOSIS — R350 Frequency of micturition: Secondary | ICD-10-CM

## 2018-10-02 DIAGNOSIS — E039 Hypothyroidism, unspecified: Secondary | ICD-10-CM

## 2018-10-02 DIAGNOSIS — R1032 Left lower quadrant pain: Secondary | ICD-10-CM | POA: Diagnosis not present

## 2018-10-02 LAB — POCT URINALYSIS DIPSTICK
Bilirubin, UA: NEGATIVE
Blood, UA: NEGATIVE
Glucose, UA: NEGATIVE
Ketones, UA: NEGATIVE
Leukocytes, UA: NEGATIVE
Nitrite, UA: NEGATIVE
PH UA: 5 (ref 5.0–8.0)
Protein, UA: NEGATIVE
Spec Grav, UA: 1.015 (ref 1.010–1.025)
Urobilinogen, UA: 0.2 E.U./dL

## 2018-10-02 LAB — COMPREHENSIVE METABOLIC PANEL
ALT: 13 U/L (ref 0–35)
AST: 15 U/L (ref 0–37)
Albumin: 4.6 g/dL (ref 3.5–5.2)
Alkaline Phosphatase: 61 U/L (ref 39–117)
BUN: 12 mg/dL (ref 6–23)
CO2: 27 meq/L (ref 19–32)
Calcium: 10.2 mg/dL (ref 8.4–10.5)
Chloride: 102 mEq/L (ref 96–112)
Creatinine, Ser: 0.67 mg/dL (ref 0.40–1.20)
GFR: 91.66 mL/min (ref >60.00)
Glucose, Bld: 85 mg/dL (ref 70–99)
Potassium: 4.8 mEq/L (ref 3.5–5.1)
Sodium: 137 mEq/L (ref 135–145)
Total Bilirubin: 0.3 mg/dL (ref 0.2–1.2)
Total Protein: 7.2 g/dL (ref 6.0–8.3)

## 2018-10-02 LAB — CBC WITH DIFFERENTIAL/PLATELET
Basophils Absolute: 0.1 10*3/uL (ref 0.0–0.1)
Basophils Relative: 0.9 % (ref 0.0–3.0)
Eosinophils Absolute: 0.3 10*3/uL (ref 0.0–0.7)
Eosinophils Relative: 4.5 % (ref 0.0–5.0)
HCT: 39.6 % (ref 36.0–46.0)
Hemoglobin: 13.6 g/dL (ref 12.0–15.0)
Lymphocytes Relative: 33.1 % (ref 12.0–46.0)
Lymphs Abs: 2 10*3/uL (ref 0.7–4.0)
MCHC: 34.3 g/dL (ref 30.0–36.0)
MCV: 93.3 fl (ref 78.0–100.0)
Monocytes Absolute: 0.7 10*3/uL (ref 0.1–1.0)
Monocytes Relative: 12.1 % — ABNORMAL HIGH (ref 3.0–12.0)
NEUTROS PCT: 49.4 % (ref 43.0–77.0)
Neutro Abs: 3 10*3/uL (ref 1.4–7.7)
Platelets: 228 10*3/uL (ref 150.0–400.0)
RBC: 4.25 Mil/uL (ref 3.87–5.11)
RDW: 12.3 % (ref 11.5–15.5)
WBC: 6.1 10*3/uL (ref 4.0–10.5)

## 2018-10-02 LAB — TSH: TSH: 0.33 u[IU]/mL — ABNORMAL LOW (ref 0.35–4.50)

## 2018-10-02 NOTE — Patient Instructions (Signed)
Please schedule for a follow up video visit in 2 weeks to recheck and go over results.   I will let you know about your lab results once they return.  You pain could be coming from several things including an ovarian issue, hernia, or GI issue. We will need to consider imaging if labs do not show [place] the answer, but this may need to be deferred until after the pandemic resolves a bit.   For now, monitor for a bulge in the groin when you feel pain. You may use tylenol if needed. No heavy exertion for now.    If you have any questions or concerns, please don't hesitate to send me a message via MyChart or call the office at [phone]. Thank you for visiting with [place] today! It's our pleasure caring for you.   Abdominal Pain, Adult Abdominal pain can be caused by many things. Often, abdominal pain is not serious and it gets better with no treatment or by being treated at home. However, sometimes abdominal pain is serious. Your health care provider will do a medical history and a physical exam to try to determine the cause of your abdominal pain. Follow these instructions at home:  Take over-the-counter and prescription medicines only as told by your health care provider. Do not take a laxative unless told by your health care provider.  Drink enough fluid to keep your urine clear or pale yellow.  Watch your condition for any changes.  Keep all follow-up visits as told by your health care provider. This is important. Contact a health care provider if:  Your abdominal pain changes or gets worse.  You are not hungry or you lose weight without trying.  You are constipated or have diarrhea for more than 2-3 days.  You have pain when you urinate or have a bowel movement.  Your abdominal pain wakes you up at night.  Your pain gets worse with meals, after eating, or with certain foods.  You are throwing up and cannot keep anything down.  You have a fever. Get help right away if:  Your  pain does not go away as soon as your health care provider told you to expect.  You cannot stop throwing up.  Your pain is only in areas of the abdomen, such as the right side or the left lower portion of the abdomen.  You have bloody or black stools, or stools that look like tar.  You have severe pain, cramping, or bloating in your abdomen.  You have signs of dehydration, such as: ? Dark urine, very little urine, or no urine. ? Cracked lips. ? Dry mouth. ? Sunken eyes. ? Sleepiness. ? Weakness. This information is not intended to replace advice given to you by your health care provider. Make sure you discuss any questions you have with your health care provider. Document Released: 03/30/2005 Document Revised: 01/08/2016 Document Reviewed: 12/02/2015 Elsevier Interactive Patient Education  2019 [employer].

## 2018-10-02 NOTE — Progress Notes (Signed)
Subjective  CC:  Chief Complaint  Patient presents with  . Abdominal Pain    Pain started months ago, comes and goes, and getting worse.. Pain is in the LLQ region and is triggered by certain movements or bending.. She denies taking any medication for the pain     HPI: Jill Ferguson is a 53 y.o. female who presents to the office today to address the problems listed above in the chief complaint.  53 yo postmenopausal female with hypothyroidism on meds presents with several month history of intermittent and progressive left lower abdominal/pelvic pain. Reports with certain exertions and movements, she will experience a moderate sharp pain in the lower abdomen; often with bending forward while working in yard etc. Pain is improved after several minutes with standing erect.Marland Kitchen at times, pain radiates to side but no left back pain or radicular sxs. Endorses nocturia and urinary frequency with stress incontinence w/o dysuria or hematuria. No flank pain, n/v/d or constipation, f/c/s, melena, pain with meals, bloating or abdominal cramping. Pain now occurring daily. No pain at night, rest pain, change in appetite, malaise, myalgias or rash. 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  Assessment  1. Left lower quadrant abdominal pain   2. Urinary frequency   3. Acquired hypothyroidism      Plan   Left abd/pelvic pain:  Unclear etiology with broad diff dx: ? GI, GU vs GYN etiology. Discussed with patient. Start eval with labs. Non-specific exam findings. No acute findings. Will likely warrant pelvic ultrasound and/or CT but need to defer for right now in pandemic setting. Recheck in 2 weeks. See AVS. Sooner if pain worsens.   Follow up: 2 week for recheck: virtual visit  12/13/2018  Orders Placed This Encounter  Procedures  . CBC with Differential/Platelet  . Comprehensive metabolic panel  . TSH  . POCT urinalysis dipstick   No  orders of the defined types were placed in this encounter.     I reviewed the patients updated PMH, FH, and SocHx.    Patient Active Problem List   Diagnosis Date Noted  . Acquired hypothyroidism 03/26/2014  . Postmenopausal symptoms 03/26/2014  . Seasonal allergies 03/26/2014   Current Meds  Medication Sig  . acyclovir (ZOVIRAX) 200 MG capsule TAKE 2 CAPSULES BY MOUTH ONCE DAILY AS NEEDED  . escitalopram (LEXAPRO) 10 MG tablet Take 1 tablet (10 mg total) by mouth daily.  Marland Kitchen levothyroxine (SYNTHROID, LEVOTHROID) 100 MCG tablet TAKE 1 TABLEY BY MOUTH EVERY MONDAY, WEDNESDAY , AND FRIDAY.  Marland Kitchen levothyroxine (SYNTHROID, LEVOTHROID) 88 MCG tablet Take 1 tablet (88 mcg total) by mouth daily before breakfast. Take 88 mg MWF  . MELATONIN PO Take by mouth.    Allergies: Patient has No Known Allergies. Family History: Patient family history includes Drug abuse in her daughter; Healthy in her son. She was adopted. Social History:  Patient  reports that she has never smoked. She has never used smokeless tobacco. She reports current alcohol use. She reports that she does not use drugs.  Review of Systems: Constitutional: Negative for fever malaise or anorexia Cardiovascular: negative for chest pain Respiratory: negative for SOB or persistent cough Gastrointestinal: Positive for abdominal pain  Objective  Vitals: BP 128/78   Pulse 80   Temp 98.1 F (36.7 C) (Oral)   Resp 16   Ht 5' 5.75" (1.67 m)   Wt 168 lb 12.8 oz (76.6 kg)  SpO2 94%   BMI 27.45 kg/m  General: no acute distress , A&Ox3 HEENT: PEERL, conjunctiva normal, Oropharynx moist,neck is supple Cardiovascular:  RRR without murmur or gallop.  Respiratory:  Good breath sounds bilaterally, CTAB with normal respiratory effort Gastrointestinal: soft, flat abdomen, normal active bowel sounds, no palpable masses, no hepatosplenomegaly, no appreciated hernias GYN: no CMT, normal non-tender fundus, no adnexal masses, ? Mild ttp in  left lower quadrant. No rebound/guarding Skin:  Warm, no rashes  Office Visit on 10/02/2018  Component Date Value Ref Range Status  . Color, UA 10/02/2018 Yellow   Final  . Clarity, UA 10/02/2018 Clear   Final  . Glucose, UA 10/02/2018 Negative  Negative Final  . Bilirubin, UA 10/02/2018 Negative   Final  . Ketones, UA 10/02/2018 Negative   Final  . Spec Grav, UA 10/02/2018 1.015  1.010 - 1.025 Final  . Blood, UA 10/02/2018 Negative   Final  . pH, UA 10/02/2018 5.0  5.0 - 8.0 Final  . Protein, UA 10/02/2018 Negative  Negative Final  . Urobilinogen, UA 10/02/2018 0.2  0.2 or 1.0 E.U./dL Final  . Nitrite, UA 73/71/0626 Negative   Final  . Leukocytes, UA 10/02/2018 Negative  Negative Final      Commons side effects, risks, benefits, and alternatives for medications and treatment plan prescribed today were discussed, and the patient expressed understanding of the given instructions. Patient is instructed to call or message via MyChart if he/she has any questions or concerns regarding our treatment plan. No barriers to understanding were identified. We discussed Red Flag symptoms and signs in detail. Patient expressed understanding regarding what to do in case of urgent or emergency type symptoms.   Medication list was reconciled, printed and provided to the patient in AVS. Patient instructions and summary information was reviewed with the patient as documented in the AVS. This note was prepared with assistance of Dragon voice recognition software. Occasional wrong-word or sound-a-like substitutions may have occurred due to the inherent limitations of voice recognition software

## 2018-10-08 ENCOUNTER — Encounter: Payer: 59 | Admitting: Family Medicine

## 2018-10-15 ENCOUNTER — Encounter: Payer: 59 | Admitting: Family Medicine

## 2018-10-16 ENCOUNTER — Encounter: Payer: Self-pay | Admitting: Family Medicine

## 2018-10-16 ENCOUNTER — Ambulatory Visit (INDEPENDENT_AMBULATORY_CARE_PROVIDER_SITE_OTHER): Payer: 59 | Admitting: Family Medicine

## 2018-10-16 ENCOUNTER — Other Ambulatory Visit: Payer: Self-pay

## 2018-10-16 VITALS — Wt 167.0 lb

## 2018-10-16 DIAGNOSIS — R1032 Left lower quadrant pain: Secondary | ICD-10-CM

## 2018-10-16 NOTE — Patient Instructions (Signed)
Please follow up if symptoms do not improve or as needed.   

## 2018-10-16 NOTE — Progress Notes (Signed)
I have discussed the procedure for the virtual visit with the patient who has given consent to proceed with assessment and treatment.   Tiara S Simmons, CMA     

## 2018-10-16 NOTE — Progress Notes (Signed)
Virtual Visit via Video Note  Subjective  CC:  Chief Complaint  Patient presents with  . Follow-up  . Abdominal Pain    She reports no changes    HPI:  I connected with Jill Ferguson on 10/16/18 at 10:00 AM EDT by a video enabled telemedicine application and verified that I am speaking with the correct person using two identifiers. Location patient: Home [facility] provider: [facility] [place] [place] Persons participating in the virtual visit: Jill DesanctisBrenda Loya, Willow Oraamille L Andy, MD Rita Oharaiara Simmons, CMA   I discussed the limitations of evaluation and management by telemedicine and the availability of in person appointments. The patient expressed understanding and agreed to proceed. . 53 year old female here for virtual visit follow-up for left lower abdominal pain.  Please refer to last note.  Since that visit she reports that pain is more present.  At first, had pain more associated with bending forward.  Now having almost daily pain and worsening if she is on her feet for prolonged period of time like when she is doing the dishes.  She using Tylenol and ibuprofen to help relieve pain.  She denies moderate to severe pain.  No associated symptoms, specifically no fevers, chills, change in bowel habits, change in appetite.  She does report that after the pelvic exam she had an increase in her pain in the left lower quadrant.  She there was some mild tenderness on that exam.  Reviewed her normal lab work.  See below Assessment  1. Left lower quadrant pain      Plan   Left lower pelvic pain next: Persistent, need imaging studies.  Start with pelvic ultrasound to look for GYN pathology including ovarian pathology.  Continue Tylenol and/or Advil as needed.  Return for urgent evaluation if pain worsens.  If ultrasound is negative recommend CT scan to rule out GI pathology or hernia.  Patient understands and agrees with care plan. I discussed the assessment and treatment plan with the  patient. The patient was provided an opportunity to ask questions and all were answered. The patient agreed with the plan and demonstrated an understanding of the instructions.   The patient was advised to call back or seek an in-person evaluation if the symptoms worsen or if the condition fails to improve as anticipated. Follow up: Follow-up as scheduled 12/13/2018  Pelvic ultrasound ordered    I reviewed the patients updated PMH, FH, and SocHx.    Patient Active Problem List   Diagnosis Date Noted  . Acquired hypothyroidism 03/26/2014  . Postmenopausal symptoms 03/26/2014  . Seasonal allergies 03/26/2014   Current Meds  Medication Sig  . acyclovir (ZOVIRAX) 200 MG capsule TAKE 2 CAPSULES BY MOUTH ONCE DAILY AS NEEDED  . escitalopram (LEXAPRO) 10 MG tablet Take 1 tablet (10 mg total) by mouth daily.  Marland Kitchen. levothyroxine (SYNTHROID, LEVOTHROID) 100 MCG tablet TAKE 1 TABLEY BY MOUTH EVERY MONDAY, WEDNESDAY , AND FRIDAY.  Marland Kitchen. levothyroxine (SYNTHROID, LEVOTHROID) 88 MCG tablet Take 1 tablet (88 mcg total) by mouth daily before breakfast. Take 88 mg MWF  . MELATONIN PO Take by mouth.    Allergies: Patient has No Known Allergies. Family History: Patient family history includes Drug abuse in her daughter; Healthy in her son. She was adopted. Social History:  Patient  reports that she has never smoked. She has never used smokeless tobacco. She reports current alcohol use. She reports that she does not use drugs.  Review of Systems: Constitutional: Negative for fever malaise or  anorexia Cardiovascular: negative for chest pain Respiratory: negative for SOB or persistent cough Gastrointestinal: negative for abdominal pain  OBJECTIVE Vitals: Wt 167 lb (75.8 kg)   BMI 27.16 kg/m  General: no acute distress , A&Ox3 Appears well  No visits with results within 1 Day(s) from this visit.  Latest known visit with results is:  Office Visit on 10/02/2018  Component Date Value Ref Range Status   . WBC 10/02/2018 6.1  4.0 - 10.5 K/uL Final  . RBC 10/02/2018 4.25  3.87 - 5.11 Mil/uL Final  . Hemoglobin 10/02/2018 13.6  12.0 - 15.0 g/dL Final  . HCT 93/81/0175 39.6  36.0 - 46.0 % Final  . MCV 10/02/2018 93.3  78.0 - 100.0 fl Final  . MCHC 10/02/2018 34.3  30.0 - 36.0 g/dL Final  . RDW 04/27/8526 12.3  11.5 - 15.5 % Final  . Platelets 10/02/2018 228.0  150.0 - 400.0 K/uL Final  . Neutrophils Relative % 10/02/2018 49.4  43.0 - 77.0 % Final  . Lymphocytes Relative 10/02/2018 33.1  12.0 - 46.0 % Final  . Monocytes Relative 10/02/2018 12.1* 3.0 - 12.0 % Final  . Eosinophils Relative 10/02/2018 4.5  0.0 - 5.0 % Final  . Basophils Relative 10/02/2018 0.9  0.0 - 3.0 % Final  . Neutro Abs 10/02/2018 3.0  1.4 - 7.7 K/uL Final  . Lymphs Abs 10/02/2018 2.0  0.7 - 4.0 K/uL Final  . Monocytes Absolute 10/02/2018 0.7  0.1 - 1.0 K/uL Final  . Eosinophils Absolute 10/02/2018 0.3  0.0 - 0.7 K/uL Final  . Basophils Absolute 10/02/2018 0.1  0.0 - 0.1 K/uL Final  . Sodium 10/02/2018 137  135 - 145 mEq/L Final  . Potassium 10/02/2018 4.8  3.5 - 5.1 mEq/L Final  . Chloride 10/02/2018 102  96 - 112 mEq/L Final  . CO2 10/02/2018 27  19 - 32 mEq/L Final  . Glucose, Bld 10/02/2018 85  70 - 99 mg/dL Final  . BUN 78/24/2353 12  6 - 23 mg/dL Final  . Creatinine, Ser 10/02/2018 0.67  0.40 - 1.20 mg/dL Final  . Total Bilirubin 10/02/2018 0.3  0.2 - 1.2 mg/dL Final  . Alkaline Phosphatase 10/02/2018 61  39 - 117 U/L Final  . AST 10/02/2018 15  0 - 37 U/L Final  . ALT 10/02/2018 13  0 - 35 U/L Final  . Total Protein 10/02/2018 7.2  6.0 - 8.3 g/dL Final  . Albumin 61/44/3154 4.6  3.5 - 5.2 g/dL Final  . Calcium 00/86/7619 10.2  8.4 - 10.5 mg/dL Final  . GFR 50/93/2671 91.66  >60.00 mL/min Final  . TSH 10/02/2018 0.33* 0.35 - 4.50 uIU/mL Final  . Color, UA 10/02/2018 Yellow   Final  . Clarity, UA 10/02/2018 Clear   Final  . Glucose, UA 10/02/2018 Negative  Negative Final  . Bilirubin, UA 10/02/2018 Negative    Final  . Ketones, UA 10/02/2018 Negative   Final  . Spec Grav, UA 10/02/2018 1.015  1.010 - 1.025 Final  . Blood, UA 10/02/2018 Negative   Final  . pH, UA 10/02/2018 5.0  5.0 - 8.0 Final  . Protein, UA 10/02/2018 Negative  Negative Final  . Urobilinogen, UA 10/02/2018 0.2  0.2 or 1.0 E.U./dL Final  . Nitrite, UA 24/58/0998 Negative   Final  . Leukocytes, UA 10/02/2018 Negative  Negative Final    Willow Ora, MD

## 2018-10-22 ENCOUNTER — Telehealth: Payer: Self-pay | Admitting: Family Medicine

## 2018-10-22 NOTE — Telephone Encounter (Signed)
Copied from CRM [id number]. Topic: General - Other >> Oct 22, 2018  3:30 PM Jaquita Rector A wrote: Reason for CRM: [facility] called to say that patient was contacted and informed that they are not scheduling any PELVIC COMPLETE WITH TRANSVAGINAL until June. Patient did tell them she is in a lot of pain she wanted to let Dr Mardelle Matte know just in case patient need to be referred someplace else.

## 2018-10-22 NOTE — Telephone Encounter (Signed)
FYI

## 2018-10-23 ENCOUNTER — Ambulatory Visit (HOSPITAL_COMMUNITY)
Admission: RE | Admit: 2018-10-23 | Discharge: 2018-10-23 | Disposition: A | Discharge status: Home / Self Care | Payer: 59 | Source: Ambulatory Visit | Attending: Family Medicine | Admitting: Family Medicine

## 2018-10-23 ENCOUNTER — Other Ambulatory Visit: Payer: Self-pay | Admitting: Family Medicine

## 2018-10-23 ENCOUNTER — Other Ambulatory Visit: Payer: Self-pay

## 2018-10-23 DIAGNOSIS — R102 Pelvic and perineal pain: Secondary | ICD-10-CM

## 2018-10-23 DIAGNOSIS — R1032 Left lower quadrant pain: Secondary | ICD-10-CM | POA: Diagnosis present

## 2018-10-23 NOTE — Telephone Encounter (Signed)
Pt needs imaging now: can we try a different place? [facility]?? Or urgent GYN referral.  What do you think stephanie? And please call pt to let her know we are working on it.

## 2018-10-24 ENCOUNTER — Telehealth: Payer: Self-pay | Admitting: Family Medicine

## 2018-10-24 DIAGNOSIS — R1032 Left lower quadrant pain: Secondary | ICD-10-CM

## 2018-10-24 NOTE — Telephone Encounter (Signed)
Copied from CRM [id number]. Topic: General - Other >> Oct 24, 2018  2:18 PM Percival Spanish wrote:  Pt cal lto say she was returning Dr Mardelle Matte call  Spoke with Steward Drone; discussed pelvic ultrasound results. Small left fibroid unlikely cause of pain. Needs further eval as pain persists and worse with standing.   Refer for CT abdomen and/or GI consult.  Due for colonoscopy as well. Thanks.

## 2018-10-26 ENCOUNTER — Telehealth: Payer: Self-pay | Admitting: Radiation Therapy

## 2018-10-26 ENCOUNTER — Telehealth: Payer: Self-pay | Admitting: Family Medicine

## 2018-10-26 NOTE — Telephone Encounter (Signed)
Called pt to confim appt.  Left detailed message on machine

## 2018-10-26 NOTE — Telephone Encounter (Signed)
Follow Up:      Returning your call from today. 

## 2018-10-29 ENCOUNTER — Inpatient Hospital Stay: Admission: RE | Admit: 2018-10-29 | Payer: 59 | Source: Ambulatory Visit

## 2018-11-08 ENCOUNTER — Telehealth: Payer: Self-pay | Admitting: Family Medicine

## 2018-11-15 ENCOUNTER — Telehealth: Payer: Self-pay | Admitting: Family Medicine

## 2018-11-15 ENCOUNTER — Other Ambulatory Visit: Payer: Self-pay | Admitting: *Deleted

## 2018-11-15 MED ORDER — LEVOTHYROXINE SODIUM 88 MCG PO TABS: 88.0000 ug | ORAL_TABLET | Freq: Every day | ORAL | 0 refills | Status: DC

## 2018-11-15 NOTE — Telephone Encounter (Signed)
Copied from CRM [id number]. Topic: Quick Communication - Rx Refill/Question >> Nov 15, 2018  4:35 PM Aretta Nip wrote: CRM for notification. See Telephone encounter for: 11/15/18.levothyroxine (SYNTHROID) [address] [facility] [address] [place], [place] - [address] [phone] (Phone) [phone] (Fax) Please return call to [facility]/ [place]. Pt is stating that she wants a different script/to change manufacturer, Pharmacy can not oblige will new without further guidance from dr. Burt Knack off on this refill until further consult.

## 2018-11-15 NOTE — Telephone Encounter (Signed)
Pt states she is still waiting on the  levothyroxine (SYNTHROID, LEVOTHROID) 88 MCG tablet  The pharmacy only requested the 100 mcg.  Pt requesting  Please  [facility] [address] [place], [place] - [address] [phone] (Phone) [phone] (Fax)

## 2018-11-15 NOTE — Telephone Encounter (Signed)
Pt aware RX sent 

## 2018-11-16 NOTE — Telephone Encounter (Signed)
See note

## 2018-11-16 NOTE — Telephone Encounter (Signed)
Called [facility] to give okay

## 2018-11-22 LAB — HM MAMMOGRAPHY

## 2018-11-23 ENCOUNTER — Encounter: Payer: Self-pay | Admitting: *Deleted

## 2018-12-13 ENCOUNTER — Other Ambulatory Visit: Payer: Self-pay

## 2018-12-13 ENCOUNTER — Ambulatory Visit (INDEPENDENT_AMBULATORY_CARE_PROVIDER_SITE_OTHER): Payer: 59 | Admitting: Family Medicine

## 2018-12-13 ENCOUNTER — Encounter: Payer: Self-pay | Admitting: Family Medicine

## 2018-12-13 VITALS — BP 132/74 | HR 71 | Temp 98.1°F | Resp 16 | Ht 66.0 in | Wt 171.2 lb

## 2018-12-13 DIAGNOSIS — Z1211 Encounter for screening for malignant neoplasm of colon: Secondary | ICD-10-CM

## 2018-12-13 DIAGNOSIS — F430 Acute stress reaction: Secondary | ICD-10-CM

## 2018-12-13 DIAGNOSIS — E039 Hypothyroidism, unspecified: Secondary | ICD-10-CM | POA: Diagnosis not present

## 2018-12-13 DIAGNOSIS — R1032 Left lower quadrant pain: Secondary | ICD-10-CM | POA: Diagnosis not present

## 2018-12-13 DIAGNOSIS — N959 Unspecified menopausal and perimenopausal disorder: Secondary | ICD-10-CM

## 2018-12-13 DIAGNOSIS — Z0000 Encounter for general adult medical examination without abnormal findings: Secondary | ICD-10-CM

## 2018-12-13 DIAGNOSIS — N6320 Unspecified lump in the left breast, unspecified quadrant: Secondary | ICD-10-CM

## 2018-12-13 DIAGNOSIS — Z1212 Encounter for screening for malignant neoplasm of rectum: Secondary | ICD-10-CM

## 2018-12-13 NOTE — Progress Notes (Signed)
Subjective  Chief Complaint  Patient presents with  . Annual Exam    Had 2 cups of coffee  . Hypothyroidism  . Stress    Taking Lexapro daily, PHQ9 today was 7    HPI: Jill Ferguson is a 53 y.o. female who presents to [place] [facility] [place] today for a Female Wellness Visit. She also has the concerns and/or needs as listed above in the chief complaint. These will be addressed in addition to the Health Maintenance Visit.   Wellness Visit: annual visit with health maintenance review and exam without Pap   HM: nl f/u mammogram and now on annual screen again. Does have a small chronic lump in left breast near surgical scar thought to be scar tissue. Lifestyle: less active due to pandemic restrictions and LLQ pain with certain mvts. Weight is stable.  Chronic disease f/u and/or acute problem visit: (deemed necessary to be done in addition to the wellness visit):  Mood: doing fine but increased stress at home due pandemic: 5 children, home schooling, husband hasn't been working etc ... managing ok on lexapro  Thyroid is now well controlled. Compliant with meds  LLQ pain persists, no worse. Canceled ct scan due to out of pocket costs. Still with pain with bending forward or prolonged standing. Doesn't see bulge. Can't reproduce pain. Nl BM and urinary function. Negative pelvic ultrasound and labs reviewed.   Assessment  1. Annual physical exam   2. Acquired hypothyroidism   3. Postmenopausal symptoms   4. Left lower quadrant abdominal pain   5. Stress reaction   6. Screening for colorectal cancer      Plan  Female Wellness Visit:  Age appropriate Health Maintenance and Prevention measures were discussed with patient. Included topics are cancer screening recommendations, ways to keep healthy (see AVS) including dietary and exercise recommendations, regular eye and dental care, use of seat belts, and avoidance of moderate alcohol use and tobacco use. Refer to GI: due  colocancer screen.  BMI: discussed patient's BMI and encouraged positive lifestyle modifications to help get to or maintain a target BMI.  HM needs and immunizations were addressed and ordered. See below for orders. See HM and immunization section for updates.  No labs today:reviewed recent normals. Nl cholesterol last year. Defer for now.  Discussed recommendations regarding Vit D and calcium supplementation (see AVS)  Chronic disease management visit and/or acute problem visit:  Thyroid is at goal.   LLQ pain: ? Hernia or other. Refer to GI for their input and recs for further evaluation. ? CT  Stress reaction: stable on meds. Behavioral strategies recommended until life returns more back to normal. Keep active.   Follow up: No follow-ups on file.  Orders Placed This Encounter  Procedures  . Ambulatory referral to Gastroenterology   No orders of the defined types were placed in this encounter.     Lifestyle: Body mass index is 27.63 kg/m. Wt Readings from Last 3 Encounters:  12/13/18 171 lb 3.2 oz (77.7 kg)  10/16/18 167 lb (75.8 kg)  10/02/18 168 lb 12.8 oz (76.6 kg)     Patient Active Problem List   Diagnosis Date Noted  . Acquired hypothyroidism 03/26/2014  . Postmenopausal symptoms 03/26/2014  . Seasonal allergies 03/26/2014   Health Maintenance  Topic Date Due  . Samul Dada  08/02/1983  . Fecal DNA (Cologuard)  08/01/2014  . INFLUENZA VACCINE  02/02/2019  . MAMMOGRAM  11/22/2019  . PAP SMEAR-Modifier  10/04/2020  . HIV  Screening  Completed    There is no immunization history on file for this patient. We updated and reviewed the patient's past history in detail and it is documented below. Allergies: Patient has No Known Allergies. Past Medical History Patient  has a past medical history of Allergy, Anxiety, and Thyroid disease. Past Surgical History Patient  has a past surgical history that includes Cesarean section; Ovarian cyst removal; and Breast  surgery. Family History: Patient family history includes Drug abuse in her daughter; Healthy in her son. She was adopted. Social History:  Patient  reports that she has never smoked. She has never used smokeless tobacco. She reports current alcohol use. She reports that she does not use drugs.  Review of Systems: Constitutional: negative for fever or malaise Ophthalmic: negative for photophobia, double vision or loss of vision Cardiovascular: negative for chest pain, dyspnea on exertion, or new LE swelling Respiratory: negative for SOB or persistent cough Gastrointestinal: negative for abdominal pain, change in bowel habits or melena Genitourinary: negative for dysuria or gross hematuria, no abnormal uterine bleeding or disharge Musculoskeletal: negative for new gait disturbance or muscular weakness Integumentary: negative for new or persistent rashes, no breast lumps Neurological: negative for TIA or stroke symptoms Psychiatric: negative for SI or delusions Allergic/Immunologic: negative for hives  Patient Care Team    Relationship Specialty Notifications Start End  Willow OraAndy, Zakiyah Diop L, MD PCP - General Family Medicine  08/29/17     Objective  Vitals: BP 132/74   Pulse 71   Temp 98.1 F (36.7 C) (Oral)   Resp 16   Ht 5\' 6"  (1.676 m)   Wt 171 lb 3.2 oz (77.7 kg)   SpO2 97%   BMI 27.63 kg/m  General:  Well developed, well nourished, no acute distress  Psych:  Alert and orientedx3,normal mood and affect HEENT:  Normocephalic, atraumatic, non-icteric sclera, PERRL, oropharynx is clear without mass or exudate, supple neck without adenopathy, mass or thyromegaly Cardiovascular:  Normal S1, S2, RRR without gallop, rub or murmur, nondisplaced PMI Respiratory:  Good breath sounds bilaterally, CTAB with normal respiratory effort Gastrointestinal: normal bowel sounds, soft, non-tender, no noted masses. No HSM MSK: no deformities, contusions. Joints are without erythema or swelling. Spine  and CVA region are nontender Skin:  Warm, no rashes or suspicious lesions noted Neurologic:    Mental status is normal. CN 2-11 are normal. Gross motor and sensory exams are normal. Normal gait. No tremor Breast Exam: No mass, skin retraction or nipple discharge is appreciated in right breast. Left breast, 3mm nontender round mobile mass at 6:00 near scar. No axillary adenopathy. Fibrocystic changes are not noted    Commons side effects, risks, benefits, and alternatives for medications and treatment plan prescribed today were discussed, and the patient expressed understanding of the given instructions. Patient is instructed to call or message via MyChart if he/she has any questions or concerns regarding our treatment plan. No barriers to understanding were identified. We discussed Red Flag symptoms and signs in detail. Patient expressed understanding regarding what to do in case of urgent or emergency type symptoms.   Medication list was reconciled, printed and provided to the patient in AVS. Patient instructions and summary information was reviewed with the patient as documented in the AVS. This note was prepared with assistance of Dragon voice recognition software. Occasional wrong-word or sound-a-like substitutions may have occurred due to the inherent limitations of voice recognition software

## 2018-12-13 NOTE — Patient Instructions (Signed)
Please return in 6 months follow up thyroid.   We will call you with information regarding your referral appointment. [facility]. If you do not hear from [place] within the next 2 weeks, please let me know. It can take 1-2 weeks to get appointments set up with the specialists.    If you have any questions or concerns, please don't hesitate to send me a message via MyChart or call the office at [phone]. Thank you for visiting with [place] today! It's our pleasure caring for you.   Inguinal Hernia, Adult An inguinal hernia develops when fat or the intestines push through a weak spot in a muscle where your leg meets your lower abdomen (groin). This creates a bulge. This kind of hernia could also be:  In your scrotum, if you are female.  In folds of skin around your vagina, if you are female. There are three types of inguinal hernias:  Hernias that can be pushed back into the abdomen (are reducible). This type rarely causes pain.  Hernias that are not reducible (are incarcerated).  Hernias that are not reducible and lose their blood supply (are strangulated). This type of hernia requires emergency surgery. What are the causes? This condition is caused by having a weak spot in the muscles or tissues in the groin. This weak spot develops over time. The hernia may poke through the weak spot when you suddenly strain your lower abdominal muscles, such as when you:  Lift a heavy object.  Strain to have a bowel movement. Constipation can lead to straining.  Cough. What increases the risk? This condition is more likely to develop in:  Men.  Pregnant women.  People who: ? Are overweight. ? Work in jobs that require long periods of standing or heavy lifting. ? Have had an inguinal hernia before. ? Smoke or have lung disease. These factors can lead to long-lasting (chronic) coughing. What are the signs or symptoms? Symptoms may depend on the size of the hernia. Often, a small inguinal hernia has  no symptoms. Symptoms of a larger hernia may include:  A lump in the groin area. This is easier to see when standing. It might not be visible when lying down.  Pain or burning in the groin. This may get worse when lifting, straining, or coughing.  A dull ache or a feeling of pressure in the groin.  In men, an unusual lump in the scrotum. Symptoms of a strangulated inguinal hernia may include:  A bulge in your groin that is very painful and tender to the touch.  A bulge that turns red or purple.  Fever, nausea, and vomiting.  Inability to have a bowel movement or to pass gas. How is this diagnosed? This condition is diagnosed based on your symptoms, your medical history, and a physical exam. Your health care provider may feel your groin area and ask you to cough. How is this treated? Treatment depends on the size of your hernia and whether you have symptoms. If you do not have symptoms, your health care provider may have you watch your hernia carefully and have you come in for follow-up visits. If your hernia is large or if you have symptoms, you may need surgery to repair the hernia. Follow these instructions at home: Lifestyle  Avoid lifting heavy objects.  Avoid standing for long periods of time.  Do not use any products that contain nicotine or tobacco, such as cigarettes and e-cigarettes. If you need help quitting, ask your health care  provider.  Maintain a healthy weight. Preventing constipation  Take actions to prevent constipation. Constipation leads to straining with bowel movements, which can make a hernia worse or cause a hernia repair to break down. Your health care provider may recommend that you: ? Drink enough fluid to keep your urine pale yellow. ? Eat foods that are high in fiber, such as fresh fruits and vegetables, whole grains, and beans. ? Limit foods that are high in fat and processed sugars, such as fried or sweet foods. ? Take an over-the-counter or  prescription medicine for constipation. General instructions  You may try to push the hernia back in place by very gently pressing on it while lying down. Do not try to force the bulge back in if it will not push in easily.  Watch your hernia for any changes in shape, size, or color. Get help right away if you notice any changes.  Take over-the-counter and prescription medicines only as told by your health care provider.  Keep all follow-up visits as told by your health care provider. This is important. Contact a health care provider if:  You have a fever.  You develop new symptoms.  Your symptoms get worse. Get help right away if:  You have pain in your groin that suddenly gets worse.  You have a bulge in your groin that: ? Suddenly gets bigger and does not get smaller. ? Becomes red or purple or painful to the touch.  You are a man and you have a sudden pain in your scrotum, or the size of your scrotum suddenly changes.  You cannot push the hernia back in place by very gently pressing on it when you are lying down. Do not try to force the bulge back in if it will not push in easily.  You have nausea or vomiting that does not go away.  You have a fast heartbeat.  You cannot have a bowel movement or pass gas. These symptoms may represent a serious problem that is an emergency. Do not wait to see if the symptoms will go away. Get medical help right away. Call your local emergency services (911 in the [place] Summary  An inguinal hernia develops when fat or the intestines push through a weak spot in a muscle where your leg meets your lower abdomen (groin).  This condition is caused by having a weak spot in muscles or tissue in your groin.  Symptoms may depend on the size of the hernia, and they may include pain or swelling in your groin. A small inguinal hernia often has no symptoms.  Treatment may not be needed if you do not have symptoms. If you have symptoms or a large  hernia, you may need surgery to repair the hernia.  Avoid lifting heavy objects. Also avoid standing for long amounts of time. This information is not intended to replace advice given to you by your health care provider. Make sure you discuss any questions you have with your health care provider. Document Released: 11/06/2008 Document Revised: 07/22/2017 Document Reviewed: 03/22/2017 Elsevier Interactive Patient Education  2019 [place] Years, Female Preventive care refers to lifestyle choices and visits with your health care provider that can promote health and wellness. What does preventive care include?   A yearly physical exam. This is also called an annual well check.  Dental exams once or twice a year.  Routine eye exams. Ask your health care provider how often you should have your eyes  checked.  Personal lifestyle choices, including: ? Daily care of your teeth and gums. ? Regular physical activity. ? Eating a healthy diet. ? Avoiding tobacco and drug use. ? Limiting alcohol use. ? Practicing safe sex. ? Taking low-dose aspirin daily starting at age 27. ? Taking vitamin and mineral supplements as recommended by your health care provider. What happens during an annual well check? The services and screenings done by your health care provider during your annual well check will depend on your age, overall health, lifestyle risk factors, and family history of disease. Counseling Your health care provider may ask you questions about your:  Alcohol use.  Tobacco use.  Drug use.  Emotional well-being.  Home and relationship well-being.  Sexual activity.  Eating habits.  Work and work Statistician.  Method of birth control.  Menstrual cycle.  Pregnancy history. Screening You may have the following tests or measurements:  Height, weight, and BMI.  Blood pressure.  Lipid and cholesterol levels. These may be checked every 5 years, or  more frequently if you are over 35 years old.  Skin check.  Lung cancer screening. You may have this screening every year starting at age 39 if you have a 30-pack-year history of smoking and currently smoke or have quit within the past 15 years.  Colorectal cancer screening. All adults should have this screening starting at age 23 and continuing until age 64. Your health care provider may recommend screening at age 15. You will have tests every 1-10 years, depending on your results and the type of screening test. People at increased risk should start screening at an earlier age. Screening tests may include: ? Guaiac-based fecal occult blood testing. ? Fecal immunochemical test (FIT). ? Stool DNA test. ? Virtual colonoscopy. ? Sigmoidoscopy. During this test, a flexible tube with a tiny camera (sigmoidoscope) is used to examine your rectum and lower colon. The sigmoidoscope is inserted through your anus into your rectum and lower colon. ? Colonoscopy. During this test, a long, thin, flexible tube with a tiny camera (colonoscope) is used to examine your entire colon and rectum.  Hepatitis C blood test.  Hepatitis B blood test.  Sexually transmitted disease (STD) testing.  Diabetes screening. This is done by checking your blood sugar (glucose) after you have not eaten for a while (fasting). You may have this done every 1-3 years.  Mammogram. This may be done every 1-2 years. Talk to your health care provider about when you should start having regular mammograms. This may depend on whether you have a family history of breast cancer.  BRCA-related cancer screening. This may be done if you have a family history of breast, ovarian, tubal, or peritoneal cancers.  Pelvic exam and Pap test. This may be done every 3 years starting at age 3. Starting at age 52, this may be done every 5 years if you have a Pap test in combination with an HPV test.  Bone density scan. This is done to screen for  osteoporosis. You may have this scan if you are at high risk for osteoporosis. Discuss your test results, treatment options, and if necessary, the need for more tests with your health care provider. Vaccines Your health care provider may recommend certain vaccines, such as:  Influenza vaccine. This is recommended every year.  Tetanus, diphtheria, and acellular pertussis (Tdap, Td) vaccine. You may need a Td booster every 10 years.  Varicella vaccine. You may need this if you have not been vaccinated.  Zoster vaccine.  You may need this after age 58.  Measles, mumps, and rubella (MMR) vaccine. You may need at least one dose of MMR if you were born in 1957 or later. You may also need a second dose.  Pneumococcal 13-valent conjugate (PCV13) vaccine. You may need this if you have certain conditions and were not previously vaccinated.  Pneumococcal polysaccharide (PPSV23) vaccine. You may need one or two doses if you smoke cigarettes or if you have certain conditions.  Meningococcal vaccine. You may need this if you have certain conditions.  Hepatitis A vaccine. You may need this if you have certain conditions or if you travel or work in places where you may be exposed to hepatitis A.  Hepatitis B vaccine. You may need this if you have certain conditions or if you travel or work in places where you may be exposed to hepatitis B.  Haemophilus influenzae type b (Hib) vaccine. You may need this if you have certain conditions. Talk to your health care provider about which screenings and vaccines you need and how often you need them. This information is not intended to replace advice given to you by your health care provider. Make sure you discuss any questions you have with your health care provider. Document Released: 07/17/2015 Document Revised: 08/10/2017 Document Reviewed: 04/21/2015 Elsevier Interactive Patient Education  2019 [employer].

## 2019-01-27 ENCOUNTER — Other Ambulatory Visit: Payer: Self-pay | Admitting: Family Medicine

## 2019-04-15 ENCOUNTER — Other Ambulatory Visit: Payer: Self-pay | Admitting: Family Medicine

## 2019-06-13 ENCOUNTER — Other Ambulatory Visit: Payer: Self-pay

## 2019-06-14 ENCOUNTER — Encounter: Payer: Self-pay | Admitting: Family Medicine

## 2019-06-14 ENCOUNTER — Ambulatory Visit (INDEPENDENT_AMBULATORY_CARE_PROVIDER_SITE_OTHER): Payer: 59 | Admitting: Family Medicine

## 2019-06-14 VITALS — BP 118/78 | HR 73 | Temp 98.1°F | Ht 66.0 in | Wt 144.0 lb

## 2019-06-14 DIAGNOSIS — F430 Acute stress reaction: Secondary | ICD-10-CM

## 2019-06-14 DIAGNOSIS — E039 Hypothyroidism, unspecified: Secondary | ICD-10-CM | POA: Diagnosis not present

## 2019-06-14 DIAGNOSIS — F5109 Other insomnia not due to a substance or known physiological condition: Secondary | ICD-10-CM

## 2019-06-14 DIAGNOSIS — L853 Xerosis cutis: Secondary | ICD-10-CM

## 2019-06-14 DIAGNOSIS — R238 Other skin changes: Secondary | ICD-10-CM

## 2019-06-14 DIAGNOSIS — R233 Spontaneous ecchymoses: Secondary | ICD-10-CM

## 2019-06-14 LAB — CBC WITH DIFFERENTIAL/PLATELET
Basophils Absolute: 0 10*3/uL (ref 0.0–0.1)
Basophils Relative: 0.6 % (ref 0.0–3.0)
Eosinophils Absolute: 0.2 10*3/uL (ref 0.0–0.7)
Eosinophils Relative: 3.6 % (ref 0.0–5.0)
HCT: 39.7 % (ref 36.0–46.0)
Hemoglobin: 13.3 g/dL (ref 12.0–15.0)
Lymphocytes Relative: 26.3 % (ref 12.0–46.0)
Lymphs Abs: 1.6 10*3/uL (ref 0.7–4.0)
MCHC: 33.4 g/dL (ref 30.0–36.0)
MCV: 93 fl (ref 78.0–100.0)
Monocytes Absolute: 0.5 10*3/uL (ref 0.1–1.0)
Monocytes Relative: 8.5 % (ref 3.0–12.0)
Neutro Abs: 3.6 10*3/uL (ref 1.4–7.7)
Neutrophils Relative %: 61 % (ref 43.0–77.0)
Platelets: 220 10*3/uL (ref 150.0–400.0)
RBC: 4.27 Mil/uL (ref 3.87–5.11)
RDW: 12.1 % (ref 11.5–15.5)
WBC: 5.9 10*3/uL (ref 4.0–10.5)

## 2019-06-14 LAB — TSH: TSH: 0.03 u[IU]/mL — ABNORMAL LOW (ref 0.35–4.50)

## 2019-06-14 MED ORDER — ALPRAZOLAM 0.5 MG PO TABS: 0.5000 mg | ORAL_TABLET | Freq: Every evening | ORAL | 0 refills | Status: DC | PRN

## 2019-06-14 NOTE — Progress Notes (Signed)
Subjective  CC:  Chief Complaint  Patient presents with  . Hypothyroidism  . Easy Bruising    HPI: Jill Ferguson is a 53 y.o. female who presents to the office today to address the problems listed above in the chief complaint.  . Hypothyroidism f/u: Jill Ferguson is a 53 y.o. female who presents for follow up of hypothyroidism. Last TSH showed control was stable being just below normal on split dosing regimen after long trial of multiple dosesCurrent symptoms: none . Patient denies change in energy level, diarrhea, heat / cold intolerance, nervousness, palpitations and or unwanted weight changes.. Symptoms have stabilized and been well-controlled.She has been compliant with the medication. Due for lab recheck.  Lab Results  Component Value Date   TSH 0.33 (L) 10/02/2018    Overweight: has been working on weight loss; using an OTC supplement called Release by [employer] and has been very successful; down almost 30 pounds. Feels much better. No AEs.   Easy bruising: however, has noted "easy bruising": has very dry skin in the winter and itches; notices bruising in areas where she scratches. No other areas w/ bruising. No bleeding: gums, joints. No h/o bruising issues prior to being on supplements. Feels fine.   Stress: has stopped the lexapro. Mood is now good. Has worked on Pharmacist, hospital and improvement in coping skills to manage stressors. Eating well and emotionally positive. As well, had low libido on medication +/- weight gain. No mood issues now.   Mild insomnia managed with otc unisom; however, will be traveling and will be stressed about sleeping environment. Request sleep aid. Has used ambien in past w/ side effects. Admits to anxiety about the trip.  HM: pt defers CRC screens and imms at this time. Education given.  Wt Readings from Last 3 Encounters:  06/14/19 144 lb (65.3 kg)  12/13/18 171 lb 3.2 oz (77.7 kg)  10/16/18 167 lb (75.8 kg)    Assessment  1. Acquired  hypothyroidism   2. Situational insomnia   3. Easy bruising   4. Stress reaction   5. Xerosis of skin      Plan   Hypothyroidism:  recheck labs today and adjust meds accordingly. Pt is clinically or mildly hyperthyroid with weight loss.  Bruising: screen for coagulopathy with pt/inr and cbc. ? Related to supplement  Stress/insomnia: rec low dose xanax as needed. Education on expectations and appropriate use discussed.   Dry skin care discussed.   Weight loss: now with normal BMI; she would like to lose about 10 more pounds, then will stop supplement.   Follow up: Return in about 6 months (around 12/13/2019) for complete physical.  Visit date not found  Orders Placed This Encounter  Procedures  . CBC w/Diff  . PT and PTT  . TSH   Meds ordered this encounter  Medications  . ALPRAZolam (XANAX) 0.5 MG tablet    Sig: Take 1 tablet (0.5 mg total) by mouth at bedtime as needed for anxiety or sleep.    Dispense:  15 tablet    Refill:  0      I reviewed the patients updated PMH, FH, and SocHx.    Patient Active Problem List   Diagnosis Date Noted  . Left breast lump -scar tissue 12/13/2018  . Acquired hypothyroidism 03/26/2014  . Postmenopausal symptoms 03/26/2014  . Seasonal allergies 03/26/2014   Current Meds  Medication Sig  . acyclovir (ZOVIRAX) 200 MG capsule TAKE 2 CAPSULES BY MOUTH ONCE DAILY AS NEEDED  .  doxylamine, Sleep, (UNISOM) 25 MG tablet Take 25 mg by mouth at bedtime as needed.  Marland Kitchen levothyroxine (SYNTHROID) 100 MCG tablet TAKE 1 TABLEY BY MOUTH EVERY MONDAY, WEDNESDAY , AND FRIDAY.  Marland Kitchen levothyroxine (SYNTHROID) 88 MCG tablet TAKE 1 TABLET BY MOUTH ONCE DAILY EVERY  TUESDAY,  THURSDAY,  SATURDAY  AND  SUNDAY  . MELATONIN PO Take by mouth.    Allergies: Patient has No Known Allergies. Family History: Patient family history includes Drug abuse in her daughter; Healthy in her son. She was adopted. Social History:  Patient  reports that she has never smoked.  She has never used smokeless tobacco. She reports current alcohol use. She reports that she does not use drugs.  Review of Systems: Constitutional: Negative for fever malaise or anorexia Cardiovascular: negative for chest pain or palpitations Respiratory: negative for SOB or persistent cough Gastrointestinal: negative for abdominal pain, neg melana Skin: neg rash  Objective  Vitals: BP 118/78 (BP Location: Left Arm, Patient Position: Sitting, Cuff Size: Normal)   Pulse 73   Temp 98.1 F (36.7 C) (Temporal)   Ht 5\' 6"  (1.676 m)   Wt 144 lb (65.3 kg)   SpO2 99%   BMI 23.24 kg/m  General: no acute distress , A&Ox3 HEENT: PEERL, no proptosis or lid lag, conjunctiva normal, Oropharynx moist,neck is supple without goiter or thyromegaly or thyroid nodules Cardiovascular:  RRR without murmur or gallop. No peripheral edema Respiratory:  Good breath sounds bilaterally, CTAB with normal respiratory effort Skin:  Warm, no rashes, no bruising Neuro: no tremor     Commons side effects, risks, benefits, and alternatives for medications and treatment plan prescribed today were discussed, and the patient expressed understanding of the given instructions. Patient is instructed to call or message via MyChart if he/she has any questions or concerns regarding our treatment plan. No barriers to understanding were identified. We discussed Red Flag symptoms and signs in detail. Patient expressed understanding regarding what to do in case of urgent or emergency type symptoms.   Medication list was reconciled, printed and provided to the patient in AVS. Patient instructions and summary information was reviewed with the patient as documented in the AVS. This note was prepared with assistance of Dragon voice recognition software. Occasional wrong-word or sound-a-like substitutions may have occurred due to the inherent limitations of voice recognition software

## 2019-06-14 NOTE — Patient Instructions (Signed)
Please return in June 2021 for your annual complete physical; please come fasting.  I'm so glad you are doing better!  I will release your lab results to you on your MyChart account with further instructions. Please reply with any questions.    If you have any questions or concerns, please don't hesitate to send me a message via MyChart or call the office at [phone]. Thank you for visiting with [place] today! It's our pleasure caring for you.

## 2019-06-15 LAB — PT AND PTT
INR: 1 (ref 0.9–1.2)
Prothrombin Time: 10.9 s (ref 9.1–12.0)
aPTT: 25 s (ref 24–33)

## 2019-06-18 ENCOUNTER — Other Ambulatory Visit: Payer: Self-pay

## 2019-06-18 DIAGNOSIS — E039 Hypothyroidism, unspecified: Secondary | ICD-10-CM

## 2019-06-18 NOTE — Progress Notes (Signed)
TSH lab orders placed.  

## 2019-06-25 ENCOUNTER — Encounter: Payer: Self-pay | Admitting: Family Medicine

## 2019-06-25 MED ORDER — ALPRAZOLAM 0.5 MG PO TABS: 0.5000 mg | ORAL_TABLET | Freq: Every evening | ORAL | 0 refills | Status: DC | PRN

## 2019-07-09 ENCOUNTER — Encounter: Payer: Self-pay | Admitting: Family Medicine

## 2019-07-10 ENCOUNTER — Other Ambulatory Visit: Payer: Self-pay

## 2019-07-11 ENCOUNTER — Other Ambulatory Visit: Payer: Self-pay

## 2019-07-11 MED ORDER — LEVOTHYROXINE SODIUM 100 MCG PO TABS: ORAL_TABLET | ORAL | 0 refills | Status: DC

## 2019-07-11 MED ORDER — LEVOTHYROXINE SODIUM 88 MCG PO TABS: ORAL_TABLET | ORAL | 2 refills | Status: DC

## 2019-08-27 ENCOUNTER — Other Ambulatory Visit: Payer: Self-pay

## 2019-08-27 ENCOUNTER — Other Ambulatory Visit (INDEPENDENT_AMBULATORY_CARE_PROVIDER_SITE_OTHER): Payer: 59

## 2019-08-27 DIAGNOSIS — E039 Hypothyroidism, unspecified: Secondary | ICD-10-CM | POA: Diagnosis not present

## 2019-08-27 LAB — TSH: TSH: 0.32 u[IU]/mL — ABNORMAL LOW (ref 0.35–4.50)

## 2019-11-29 ENCOUNTER — Other Ambulatory Visit: Payer: Self-pay | Admitting: Family Medicine

## 2019-12-03 ENCOUNTER — Telehealth: Payer: Self-pay | Admitting: Family Medicine

## 2019-12-03 NOTE — Telephone Encounter (Signed)
MEDICATION:  EUTHYROX 88 MCG tablet  PHARMACY: [facility] [address] [place], [place] - [address] Phone:  [phone]  Fax:  [phone]      Comments:   **Let patient know to contact pharmacy at the end of the day to make sure medication is ready. **  ** Please notify patient to allow 48-72 hours to process**  **Encourage patient to contact the pharmacy for refills or they can request refills through [facility]**

## 2019-12-03 NOTE — Telephone Encounter (Signed)
Medication sent in 11/30/19

## 2019-12-16 ENCOUNTER — Encounter: Payer: Self-pay | Admitting: Family Medicine

## 2019-12-16 ENCOUNTER — Encounter: Payer: 59 | Admitting: Family Medicine

## 2020-01-28 ENCOUNTER — Other Ambulatory Visit: Payer: Self-pay | Admitting: Family Medicine

## 2020-02-05 ENCOUNTER — Telehealth: Payer: Self-pay | Admitting: Family Medicine

## 2020-02-05 NOTE — Telephone Encounter (Signed)
Patient is requesting a RX for a sleep aide, she states she would like something she can take on a long term basis while she needs them. She asked if she needed a appt prior.  Please advise.

## 2020-02-05 NOTE — Telephone Encounter (Signed)
Patient was seen in office 06-14-2019 where situational insomnia was discussed. Per office note patient used otc unisom for insomnia. Does patient need to be in office or virtually? Please advise.

## 2020-02-07 NOTE — Telephone Encounter (Signed)
Noted! Thank you

## 2020-02-07 NOTE — Telephone Encounter (Signed)
Scheduled PT for 8/9 at 10:00 am

## 2020-02-07 NOTE — Telephone Encounter (Signed)
Rec office visit to further discuss

## 2020-02-10 ENCOUNTER — Telehealth: Payer: 59 | Admitting: Family Medicine

## 2020-02-11 ENCOUNTER — Encounter: Payer: Self-pay | Admitting: Physician Assistant

## 2020-02-11 ENCOUNTER — Telehealth (INDEPENDENT_AMBULATORY_CARE_PROVIDER_SITE_OTHER): Payer: 59 | Admitting: Physician Assistant

## 2020-02-11 VITALS — Temp 97.3°F | Ht 66.0 in | Wt 134.0 lb

## 2020-02-11 DIAGNOSIS — F5109 Other insomnia not due to a substance or known physiological condition: Secondary | ICD-10-CM | POA: Diagnosis not present

## 2020-02-11 MED ORDER — TRAZODONE HCL 50 MG PO TABS: 25.0000 mg | ORAL_TABLET | Freq: Every evening | ORAL | 1 refills | Status: DC | PRN

## 2020-02-11 NOTE — Progress Notes (Signed)
Virtual Visit via Video   I connected with Jill Ferguson on 02/11/20 at 12:00 PM EDT by a video enabled telemedicine application and verified that I am speaking with the correct person using two identifiers. Location patient: Home Location provider: [facility], Office Persons participating in the virtual visit: Jill Ferguson, Lemke PA-C  I discussed the limitations of evaluation and management by telemedicine and the availability of in person appointments. The patient expressed understanding and agreed to proceed.  Subjective:   HPI:   Insomnia Chronic issue, has been dealing with for years. Past treatments include: Ambien, Xanax, Unisom, Benadryl Xanax works well but she doesn't want to be dependent on a controlled substance. She is well-versed in good sleep hygiene, has several rituals to help her sleep at night. Does get up to urinate at least once nightly.   ROS: See pertinent positives and negatives per HPI.  Patient Active Problem List   Diagnosis Date Noted  . Left breast lump -scar tissue 12/13/2018  . Acquired hypothyroidism 03/26/2014  . Postmenopausal symptoms 03/26/2014  . Seasonal allergies 03/26/2014    Social History   Tobacco Use  . Smoking status: Never Smoker  . Smokeless tobacco: Never Used  Substance Use Topics  . Alcohol use: Yes    Comment: Occasional    Current Outpatient Medications:  .  acyclovir (ZOVIRAX) 200 MG capsule, TAKE 2 CAPSULES BY MOUTH ONCE DAILY AS NEEDED, Disp: , Rfl: 0 .  doxylamine, Sleep, (UNISOM) 25 MG tablet, Take 25 mg by mouth at bedtime as needed., Disp: , Rfl:  .  levothyroxine (SYNTHROID) 100 MCG tablet, TAKE 1 TABLEY BY MOUTH on Sundays, Disp: 36 tablet, Rfl: 0 .  levothyroxine (SYNTHROID) 88 MCG tablet, TAKE 1 TABLET BY MOUTH ONCE DAILY EXCEPT  SUNDAYS, Disp: 48 tablet, Rfl: 0 .  ALPRAZolam (XANAX) 0.5 MG tablet, Take 1 tablet (0.5 mg total) by mouth at bedtime as needed for anxiety or sleep. (Patient not  taking: Reported on 02/11/2020), Disp: 15 tablet, Rfl: 0 .  MELATONIN PO, Take by mouth. (Patient not taking: Reported on 02/11/2020), Disp: , Rfl:  .  traZODone (DESYREL) 50 MG tablet, Take 0.5-1 tablets (25-50 mg total) by mouth at bedtime as needed for sleep., Disp: 30 tablet, Rfl: 1  No Known Allergies  Objective:   VITALS: Per patient if applicable, see vitals. GENERAL: Alert, appears well and in no acute distress. HEENT: Atraumatic, conjunctiva clear, no obvious abnormalities on inspection of external nose and ears. NECK: Normal movements of the head and neck. CARDIOPULMONARY: No increased WOB. Speaking in clear sentences. I:E ratio WNL.  MS: Moves all visible extremities without noticeable abnormality. PSYCH: Pleasant and cooperative, well-groomed. Speech normal rate and rhythm. Affect is appropriate. Insight and judgement are appropriate. Attention is focused, linear, and appropriate.  NEURO: CN grossly intact. Oriented as arrived to appointment on time with no prompting. Moves both UE equally.  SKIN: No obvious lesions, wounds, erythema, or cyanosis noted on face or hands.  Assessment and Plan:   Jill Ferguson was seen today for insomnia.  Diagnoses and all orders for this visit:  Situational insomnia Uncontrolled, chronic. Medication options reviewed. Discussed trazodone, her daughter actually takes this and she would like to trial it. Start 25 mg daily and increase as tolerated, up to 100 mg. If ineffective or other concerns, recommend follow-up with PCP.  Other orders -     traZODone (DESYREL) 50 MG tablet; Take 0.5-1 tablets (25-50 mg total) by mouth at bedtime as needed  for sleep.   . Reviewed expectations re: course of current medical issues. . Discussed self-management of symptoms. . Outlined signs and symptoms indicating need for more acute intervention. . Patient verbalized understanding and all questions were answered. Marland Kitchen Health Maintenance issues including appropriate  healthy diet, exercise, and smoking avoidance were discussed with patient. . See orders for this visit as documented in the electronic medical record.  I discussed the assessment and treatment plan with the patient. The patient was provided an opportunity to ask questions and all were answered. The patient agreed with the plan and demonstrated an understanding of the instructions.   The patient was advised to call back or seek an in-person evaluation if the symptoms worsen or if the condition fails to improve as anticipated.   CMA or LPN served as scribe during this visit. History, Physical, and Plan performed by medical provider. The above documentation has been reviewed and is accurate and complete.  [place], [place] 02/11/2020

## 2020-03-23 ENCOUNTER — Other Ambulatory Visit: Payer: Self-pay

## 2020-03-24 ENCOUNTER — Ambulatory Visit (INDEPENDENT_AMBULATORY_CARE_PROVIDER_SITE_OTHER): Payer: 59 | Admitting: Family Medicine

## 2020-03-24 ENCOUNTER — Other Ambulatory Visit: Payer: Self-pay

## 2020-03-24 ENCOUNTER — Encounter: Payer: Self-pay | Admitting: Family Medicine

## 2020-03-24 VITALS — BP 120/76 | HR 72 | Temp 98.2°F | Resp 15 | Ht 66.0 in | Wt 134.4 lb

## 2020-03-24 DIAGNOSIS — Z1231 Encounter for screening mammogram for malignant neoplasm of breast: Secondary | ICD-10-CM | POA: Diagnosis not present

## 2020-03-24 DIAGNOSIS — N959 Unspecified menopausal and perimenopausal disorder: Secondary | ICD-10-CM

## 2020-03-24 DIAGNOSIS — Z0000 Encounter for general adult medical examination without abnormal findings: Secondary | ICD-10-CM

## 2020-03-24 DIAGNOSIS — E039 Hypothyroidism, unspecified: Secondary | ICD-10-CM

## 2020-03-24 DIAGNOSIS — Z1159 Encounter for screening for other viral diseases: Secondary | ICD-10-CM

## 2020-03-24 DIAGNOSIS — F5109 Other insomnia not due to a substance or known physiological condition: Secondary | ICD-10-CM | POA: Diagnosis not present

## 2020-03-24 NOTE — Progress Notes (Signed)
Subjective  Chief Complaint  Patient presents with  . Annual Exam    non-fasting  . Hypothyroidism  . Insomnia    HPI: Jill Ferguson is a 54 y.o. female who presents to [facility] today for a Female Wellness Visit. She also has the concerns and/or needs as listed above in the chief complaint. These will be addressed in addition to the Health Maintenance Visit.   Wellness Visit: annual visit with health maintenance review and exam without Pap   HM: due mammo and CRC screen; due flu shot. Pap up to date. Pt refuses crc screen of any kind. Declines flu vaccine Chronic disease f/u and/or acute problem visit: (deemed necessary to be done in addition to the wellness visit):  Hypothyroidism: overdue for recheck. Last tsh was low and meds were adjusted down. She endorses palpitations and anxiety sxs. Mild sweats. Stable weight on weight loss program.   Insomnia: now much improved on trazadone. No AEs.   Left breast lump: has figured out it is the valve to the breast implant that has relocated. Due for mammo.  Wt Readings from Last 3 Encounters:  03/24/20 134 lb 6.4 oz (61 kg)  02/11/20 134 lb (60.8 kg)  06/14/19 144 lb (65.3 kg)   Lab Results  Component Value Date   TSH 0.32 (L) 08/27/2019     Assessment  1. Annual physical exam   2. Acquired hypothyroidism   3. Postmenopausal symptoms   4. Situational insomnia   5. Need for hepatitis C screening test   6. Encounter for screening mammogram for breast cancer      Plan  Female Wellness Visit:  Age appropriate Health Maintenance and Prevention measures were discussed with patient. Included topics are cancer screening recommendations, ways to keep healthy (see AVS) including dietary and exercise recommendations, regular eye and dental care, use of seat belts, and avoidance of moderate alcohol use and tobacco use. Refuses crc screen - we discussed different options. Understands risks/benefits. Mammo:  pt to schedule.   BMI: discussed patient's BMI and encouraged positive lifestyle modifications to help get to or maintain a target BMI.  HM needs and immunizations were addressed and ordered. See below for orders. See HM and immunization section for updates. Declines flu vaccine.   Routine labs and screening tests ordered including cmp, cbc and lipids where appropriate.  Discussed recommendations regarding Vit D and calcium supplementation (see AVS)  Chronic disease management visit and/or acute problem visit:  Low thyroid: recheck levels; had been over treated. Has some clinical sxs of hyperthyroidism. Will adjust meds if indicated.  Insomnia: improved ; continue trazodone.   Mild postmenopausal sxs; tolerable  Follow up: 12 months cpe  Orders Placed This Encounter  Procedures  . MM DIGITAL SCREENING BILATERAL  . CBC with Differential/Platelet  . COMPLETE METABOLIC PANEL WITH GFR  . Lipid panel  . TSH  . Hepatitis C antibody   No orders of the defined types were placed in this encounter.     Lifestyle: Body mass index is 21.69 kg/m. Wt Readings from Last 3 Encounters:  03/24/20 134 lb 6.4 oz (61 kg)  02/11/20 134 lb (60.8 kg)  06/14/19 144 lb (65.3 kg)     Patient Active Problem List   Diagnosis Date Noted  . Situational insomnia 03/24/2020  . Left breast lump - implant valve 12/13/2018  . Acquired hypothyroidism 03/26/2014  . Postmenopausal symptoms 03/26/2014  . Seasonal allergies 03/26/2014   Health Maintenance  Topic Date  Due  . Hepatitis C Screening  Never done  . MAMMOGRAM  11/22/2019  . INFLUENZA VACCINE  04/23/2020 (Originally 02/02/2020)  . TETANUS/TDAP  06/13/2020 (Originally 08/02/1983)  . Fecal DNA (Cologuard)  03/24/2021 (Originally 08/01/2014)  . PAP SMEAR-Modifier  10/05/2022  . HIV Screening  Completed    There is no immunization history on file for this patient. We updated and reviewed the patient's past history in detail and it is  documented below. Allergies: Patient has No Known Allergies. Past Medical History Patient  has a past medical history of Allergy, Anxiety, and Thyroid disease. Past Surgical History Patient  has a past surgical history that includes Cesarean section; Ovarian cyst removal; and Breast surgery. Family History: Patient family history includes Drug abuse in her daughter; Healthy in her son. She was adopted. Social History:  Patient  reports that she has never smoked. She has never used smokeless tobacco. She reports current alcohol use. She reports that she does not use drugs.  Review of Systems: Constitutional: negative for fever or malaise Ophthalmic: negative for photophobia, double vision or loss of vision Cardiovascular: negative for chest pain, dyspnea on exertion, or new LE swelling Respiratory: negative for SOB or persistent cough Gastrointestinal: negative for abdominal pain, change in bowel habits or melena Genitourinary: negative for dysuria or gross hematuria, no abnormal uterine bleeding or disharge Musculoskeletal: negative for new gait disturbance or muscular weakness Integumentary: negative for new or persistent rashes, no breast lumps Neurological: negative for TIA or stroke symptoms Psychiatric: negative for SI or delusions Allergic/Immunologic: negative for hives  Patient Care Team    Relationship Specialty Notifications Start End  Willow Ora, MD PCP - General Family Medicine  08/29/17     Objective  Vitals: BP 120/76   Pulse 72   Temp 98.2 F (36.8 C) (Temporal)   Resp 15   Ht 5\' 6"  (1.676 m)   Wt 134 lb 6.4 oz (61 kg)   SpO2 98%   BMI 21.69 kg/m  General:  Well developed, well nourished, no acute distress  Psych:  Alert and orientedx3,normal mood and affect HEENT:  Normocephalic, atraumatic, non-icteric sclera,  supple neck without adenopathy, mass or thyromegaly Cardiovascular:  Normal S1, S2, RRR without gallop, rub or murmur Respiratory:  Good  breath sounds bilaterally, CTAB with normal respiratory effort Gastrointestinal: normal bowel sounds, soft, non-tender, no noted masses. No HSM MSK: no deformities, contusions. Joints are without erythema or swelling.  Skin:  Warm, no rashes or suspicious lesions noted Neurologic:    Mental status is normal. CN 2-11 are normal. Gross motor and sensory exams are normal. Normal gait. No tremor Breast Exam: declined   Commons side effects, risks, benefits, and alternatives for medications and treatment plan prescribed today were discussed, and the patient expressed understanding of the given instructions. Patient is instructed to call or message via MyChart if he/she has any questions or concerns regarding our treatment plan. No barriers to understanding were identified. We discussed Red Flag symptoms and signs in detail. Patient expressed understanding regarding what to do in case of urgent or emergency type symptoms.   Medication list was reconciled, printed and provided to the patient in AVS. Patient instructions and summary information was reviewed with the patient as documented in the AVS. This note was prepared with assistance of Dragon voice recognition software. Occasional wrong-word or sound-a-like substitutions may have occurred due to the inherent limitations of voice recognition software  This visit occurred during the SARS-CoV-2 public health emergency.  Safety protocols were in place, including screening questions prior to the visit, additional usage of staff PPE, and extensive cleaning of exam room while observing appropriate contact time as indicated for disinfecting solutions.

## 2020-03-24 NOTE — Patient Instructions (Signed)
Please return in 12 months for your annual complete physical; please come fasting.  I will release your lab results to you on your MyChart account with further instructions. Please reply with any questions.    If you have any questions or concerns, please don't hesitate to send me a message via MyChart or call the office at [phone]. Thank you for visiting with us today! It's our pleasure caring for you.  I have ordered a mammogram and/or bone density for you as we discussed today: [x]  Mammogram  []  Bone Density  Please call the office checked below to schedule your appointment: Your appointment will at the following location  [x]  [facility]      [address]       [place], [place]        [phone]         []  [facility]  [address]   [place], [place]  [phone]  

## 2020-03-25 LAB — COMPLETE METABOLIC PANEL WITH GFR
AG Ratio: 1.7 (calc) (ref 1.0–2.5)
ALT: 10 U/L (ref 6–29)
AST: 15 U/L (ref 10–35)
Albumin: 4.6 g/dL (ref 3.6–5.1)
Alkaline phosphatase (APISO): 58 U/L (ref 37–153)
BUN: 9 mg/dL (ref 7–25)
CO2: 28 mmol/L (ref 20–32)
Calcium: 10.1 mg/dL (ref 8.6–10.4)
Chloride: 100 mmol/L (ref 98–110)
Creat: 0.68 mg/dL (ref 0.50–1.05)
GFR, Est African American: 114 mL/min/{1.73_m2} (ref >=60)
GFR, Est Non African American: 98 mL/min/{1.73_m2} (ref >=60)
Globulin: 2.7 g/dL (calc) (ref 1.9–3.7)
Glucose, Bld: 90 mg/dL (ref 65–99)
Potassium: 4.5 mmol/L (ref 3.5–5.3)
Sodium: 135 mmol/L (ref 135–146)
Total Bilirubin: 0.4 mg/dL (ref 0.2–1.2)
Total Protein: 7.3 g/dL (ref 6.1–8.1)

## 2020-03-25 LAB — LIPID PANEL
Cholesterol: 189 mg/dL (ref <200)
HDL: 76 mg/dL (ref >=50)
LDL Cholesterol (Calc): 96 mg/dL (calc)
Non-HDL Cholesterol (Calc): 113 mg/dL (calc) (ref <130)
Total CHOL/HDL Ratio: 2.5 (calc) (ref <5.0)
Triglycerides: 77 mg/dL (ref <150)

## 2020-03-25 LAB — CBC WITH DIFFERENTIAL/PLATELET
Absolute Monocytes: 414 cells/uL (ref 200–950)
Basophils Absolute: 41 cells/uL (ref 0–200)
Basophils Relative: 0.9 %
Eosinophils Absolute: 99 cells/uL (ref 15–500)
Eosinophils Relative: 2.2 %
HCT: 36.7 % (ref 35.0–45.0)
Hemoglobin: 12.4 g/dL (ref 11.7–15.5)
Lymphs Abs: 1476 cells/uL (ref 850–3900)
MCH: 31.7 pg (ref 27.0–33.0)
MCHC: 33.8 g/dL (ref 32.0–36.0)
MCV: 93.9 fL (ref 80.0–100.0)
MPV: 10.5 fL (ref 7.5–12.5)
Monocytes Relative: 9.2 %
Neutro Abs: 2471 cells/uL (ref 1500–7800)
Neutrophils Relative %: 54.9 %
Platelets: 219 10*3/uL (ref 140–400)
RBC: 3.91 10*6/uL (ref 3.80–5.10)
RDW: 11.6 % (ref 11.0–15.0)
Total Lymphocyte: 32.8 %
WBC: 4.5 10*3/uL (ref 3.8–10.8)

## 2020-03-25 LAB — HEPATITIS C ANTIBODY
Hepatitis C Ab: NONREACTIVE
SIGNAL TO CUT-OFF: 0.01 (ref <1.00)

## 2020-03-25 LAB — TSH: TSH: 1.35 mIU/L

## 2020-03-25 MED ORDER — LEVOTHYROXINE SODIUM 88 MCG PO TABS
88.0000 ug | ORAL_TABLET | Freq: Every day | ORAL | 3 refills | Status: DC
Start: 2020-03-25 — End: 2021-03-12

## 2020-03-25 MED ORDER — LEVOTHYROXINE SODIUM 100 MCG PO TABS
100.0000 ug | ORAL_TABLET | ORAL | 3 refills | Status: DC
Start: 2020-03-25 — End: 2021-10-29

## 2020-03-25 NOTE — Addendum Note (Signed)
Addended by: Asencion Partridge on: 03/25/2020 03:30 PM   Modules accepted: Orders

## 2020-03-30 ENCOUNTER — Other Ambulatory Visit: Payer: Self-pay | Admitting: Physician Assistant

## 2020-04-10 ENCOUNTER — Other Ambulatory Visit: Payer: Self-pay | Admitting: Family Medicine

## 2020-04-10 ENCOUNTER — Ambulatory Visit
Admission: RE | Admit: 2020-04-10 | Discharge: 2020-04-10 | Disposition: A | Discharge status: Home / Self Care | Payer: 59 | Source: Ambulatory Visit | Attending: Family Medicine | Admitting: Family Medicine

## 2020-04-10 ENCOUNTER — Other Ambulatory Visit: Payer: Self-pay

## 2020-04-10 DIAGNOSIS — Z1231 Encounter for screening mammogram for malignant neoplasm of breast: Secondary | ICD-10-CM

## 2020-04-13 ENCOUNTER — Other Ambulatory Visit: Payer: Self-pay | Admitting: Family Medicine

## 2020-04-13 DIAGNOSIS — R234 Changes in skin texture: Secondary | ICD-10-CM

## 2020-05-06 ENCOUNTER — Ambulatory Visit
Admission: RE | Admit: 2020-05-06 | Discharge: 2020-05-06 | Disposition: A | Discharge status: Home / Self Care | Payer: 59 | Source: Ambulatory Visit | Attending: Family Medicine | Admitting: Family Medicine

## 2020-05-06 ENCOUNTER — Other Ambulatory Visit: Payer: Self-pay

## 2020-05-06 ENCOUNTER — Ambulatory Visit: Payer: 59

## 2020-05-06 DIAGNOSIS — R234 Changes in skin texture: Secondary | ICD-10-CM

## 2020-05-07 ENCOUNTER — Other Ambulatory Visit: Payer: 59

## 2020-05-07 DIAGNOSIS — Z20822 Contact with and (suspected) exposure to covid-19: Secondary | ICD-10-CM

## 2020-05-08 LAB — SARS-COV-2, NAA 2 DAY TAT

## 2020-05-08 LAB — NOVEL CORONAVIRUS, NAA: SARS-CoV-2, NAA: NOT DETECTED

## 2021-03-12 ENCOUNTER — Other Ambulatory Visit: Payer: Self-pay | Admitting: Family Medicine

## 2021-03-12 ENCOUNTER — Other Ambulatory Visit: Payer: Self-pay

## 2021-03-26 ENCOUNTER — Ambulatory Visit (INDEPENDENT_AMBULATORY_CARE_PROVIDER_SITE_OTHER): Payer: Self-pay | Admitting: Physician Assistant

## 2021-03-26 ENCOUNTER — Telehealth: Payer: Self-pay

## 2021-03-26 ENCOUNTER — Other Ambulatory Visit: Payer: Self-pay

## 2021-03-26 ENCOUNTER — Encounter: Payer: Self-pay | Admitting: Physician Assistant

## 2021-03-26 VITALS — BP 118/76 | HR 84 | Temp 98.2°F | Ht 66.0 in | Wt 131.2 lb

## 2021-03-26 DIAGNOSIS — L309 Dermatitis, unspecified: Secondary | ICD-10-CM

## 2021-03-26 MED ORDER — CLOBETASOL PROPIONATE 0.05 % EX OINT: 1.0000 "application " | TOPICAL_OINTMENT | Freq: Every day | CUTANEOUS | 0 refills | Status: AC

## 2021-03-26 NOTE — Telephone Encounter (Signed)
Pt was seen

## 2021-03-26 NOTE — Progress Notes (Signed)
   Subjective:    Patient ID: Jill Ferguson, female    DOB: 1966/06/12, 55 y.o.   MRN: [id number]  Chief Complaint  Patient presents with   Vaginitis    HPI Patient is in today for vaginal symptoms x 7-10 days ago. Started itching after using a razor, which she never usually does. Noticed a bump a few days later.  No discharge or odor. She did try Monistat without relief. Tried Vaseline and Triple antibiotic ointment as well.  Takes Acyclovir for Shingles prevention daily.  Still itching a lot and nervous about discoloration / ?lesions around female areas.   Past Medical History:  Diagnosis Date   Allergy    seasonal   Anxiety    Thyroid disease    Hypothyroid    Past Surgical History:  Procedure Laterality Date   BREAST SURGERY     2012 augmentation   CESAREAN SECTION     1982, 2001   OVARIAN CYST REMOVAL      Family History  Adopted: Yes  Problem Relation Age of Onset   Healthy Son    Drug abuse Daughter     Social History   Tobacco Use   Smoking status: Never   Smokeless tobacco: Never  Vaping Use   Vaping Use: Never used  Substance Use Topics   Alcohol use: Yes    Comment: Occasional   Drug use: No     No Known Allergies  Review of Systems REFER TO HPI FOR PERTINENT POSITIVES AND NEGATIVES      Objective:     BP 118/76   Pulse 84   Temp 98.2 F (36.8 C)   Ht 5\' 6"  (1.676 m)   Wt 131 lb 3.2 oz (59.5 kg)   SpO2 99%   BMI 21.18 kg/m   Wt Readings from Last 3 Encounters:  03/26/21 131 lb 3.2 oz (59.5 kg)  03/24/20 134 lb 6.4 oz (61 kg)  02/11/20 134 lb (60.8 kg)    BP Readings from Last 3 Encounters:  03/26/21 118/76  03/24/20 120/76  06/14/19 118/78     Physical Exam Vitals and nursing note reviewed. Exam conducted with a chaperone present.  Constitutional:      Appearance: Normal appearance.  Genitourinary:    Comments: Vulvar dermatitis present; no lesions Neurological:     Mental Status: She is alert.        Assessment & Plan:   Problem List Items Addressed This Visit   None Visit Diagnoses     Vulvar dermatitis    -  Primary        Meds ordered this encounter  Medications   clobetasol ointment (TEMOVATE) 0.05 %    Sig: Apply 1 application topically at bedtime for 15 days.    Dispense:  15 g    Refill:  0    1. Vulvar dermatitis Reassured pt this is most likely 2/2 razor blade and shaving cream or shampoo used in this area. Worsened by excoriation. Will try thin layer of clobetasol ointment & cooling perineum packs to help alleviate symptoms. Recheck prn.   Yanice Maqueda M Johnae Friley, PA-C

## 2021-03-26 NOTE — Telephone Encounter (Signed)
PT called and she believes she that she has a yeast infection. Pt is scheduled with Alyssa on Monday, 9/26. Offered pt an appt today at [facility] but denied. Can pt wait until Monday? Please Advise.

## 2021-03-29 ENCOUNTER — Ambulatory Visit: Payer: 59 | Admitting: Physician Assistant

## 2021-06-10 ENCOUNTER — Other Ambulatory Visit: Payer: Self-pay | Admitting: Family Medicine

## 2021-10-29 ENCOUNTER — Encounter: Payer: Self-pay | Admitting: Family Medicine

## 2021-10-29 ENCOUNTER — Ambulatory Visit (INDEPENDENT_AMBULATORY_CARE_PROVIDER_SITE_OTHER): Payer: Self-pay | Admitting: Family Medicine

## 2021-10-29 VITALS — BP 100/64 | HR 81 | Temp 98.3°F | Ht 66.0 in | Wt 144.5 lb

## 2021-10-29 DIAGNOSIS — E039 Hypothyroidism, unspecified: Secondary | ICD-10-CM

## 2021-10-29 DIAGNOSIS — F5109 Other insomnia not due to a substance or known physiological condition: Secondary | ICD-10-CM

## 2021-10-29 DIAGNOSIS — Z0000 Encounter for general adult medical examination without abnormal findings: Secondary | ICD-10-CM

## 2021-10-29 LAB — COMPREHENSIVE METABOLIC PANEL
ALT: 11 U/L (ref 0–35)
AST: 18 U/L (ref 0–37)
Albumin: 4.7 g/dL (ref 3.5–5.2)
Alkaline Phosphatase: 52 U/L (ref 39–117)
BUN: 10 mg/dL (ref 6–23)
CO2: 28 mEq/L (ref 19–32)
Calcium: 9.8 mg/dL (ref 8.4–10.5)
Chloride: 99 mEq/L (ref 96–112)
Creatinine, Ser: 0.79 mg/dL (ref 0.40–1.20)
GFR: 83.14 mL/min (ref >60.00)
Glucose, Bld: 78 mg/dL (ref 70–99)
Potassium: 4.4 mEq/L (ref 3.5–5.1)
Sodium: 136 mEq/L (ref 135–145)
Total Bilirubin: 0.4 mg/dL (ref 0.2–1.2)
Total Protein: 7.4 g/dL (ref 6.0–8.3)

## 2021-10-29 LAB — CBC WITH DIFFERENTIAL/PLATELET
Basophils Absolute: 0 10*3/uL (ref 0.0–0.1)
Basophils Relative: 0.7 % (ref 0.0–3.0)
Eosinophils Absolute: 0.2 10*3/uL (ref 0.0–0.7)
Eosinophils Relative: 3.5 % (ref 0.0–5.0)
HCT: 38.6 % (ref 36.0–46.0)
Hemoglobin: 12.8 g/dL (ref 12.0–15.0)
Lymphocytes Relative: 22.1 % (ref 12.0–46.0)
Lymphs Abs: 1.5 10*3/uL (ref 0.7–4.0)
MCHC: 33.2 g/dL (ref 30.0–36.0)
MCV: 95.8 fl (ref 78.0–100.0)
Monocytes Absolute: 0.5 10*3/uL (ref 0.1–1.0)
Monocytes Relative: 7.8 % (ref 3.0–12.0)
Neutro Abs: 4.6 10*3/uL (ref 1.4–7.7)
Neutrophils Relative %: 65.9 % (ref 43.0–77.0)
Platelets: 234 10*3/uL (ref 150.0–400.0)
RBC: 4.03 Mil/uL (ref 3.87–5.11)
RDW: 12.4 % (ref 11.5–15.5)
WBC: 6.9 10*3/uL (ref 4.0–10.5)

## 2021-10-29 LAB — LIPID PANEL
Cholesterol: 245 mg/dL — ABNORMAL HIGH (ref 0–200)
HDL: 100.8 mg/dL (ref >39.00)
LDL Cholesterol: 129 mg/dL — ABNORMAL HIGH (ref 0–99)
NonHDL: 144.56
Total CHOL/HDL Ratio: 2
Triglycerides: 80 mg/dL (ref 0.0–149.0)
VLDL: 16 mg/dL (ref 0.0–40.0)

## 2021-10-29 LAB — TSH: TSH: 63.75 u[IU]/mL — ABNORMAL HIGH (ref 0.35–5.50)

## 2021-10-29 MED ORDER — LEVOTHYROXINE SODIUM 88 MCG PO TABS: 88.0000 ug | ORAL_TABLET | Freq: Every day | ORAL | 0 refills | Status: DC

## 2021-10-29 NOTE — Patient Instructions (Signed)
I will release your lab results to you on your MyChart account with further instructions. You may see the results before I do, but when I review them I will send you a message with my report or have my assistant call you if things need to be discussed. Please reply to my message with any questions. Thank you!  ? ?I will let you know when to recheck your thyroid once the labs results return.  ? ?If you have any questions or concerns, please don't hesitate to send me a message via MyChart or call the office at [phone]. Thank you for visiting with [place] today! It's our pleasure caring for you.  ?

## 2021-10-29 NOTE — Progress Notes (Signed)
?Subjective  ?Chief Complaint  ?Patient presents with  ? Annual Exam  ?  Pt is here for Annual exam and is not currently fasting  ? ? ?HPI: Jill Ferguson is a 56 y.o. female who presents to [facility] today for a Female Wellness Visit. She also has the concerns and/or needs as listed above in the chief complaint. These will be addressed in addition to the Health Maintenance Visit.  ? ?Wellness Visit: annual visit with health maintenance review and exam without Pap ? ?HM: pt last here 18 months ago. She refuses mammogram, colonoscopy, cologuard at this time.  ?Chronic disease f/u and/or acute problem visit: (deemed necessary to be done in addition to the wellness visit): ?Hypothyroidism: reports ran out of medications 10 days ago although last prescribed in December (?). Feels fine.  ?Sleep: using unisom for sleep. Had side effects from the trazadone.  ? ?Assessment  ?1. Annual physical exam   ?2. Acquired hypothyroidism   ?3. Situational insomnia   ? ?  ?Plan  ?Female Wellness Visit: ?Age appropriate Health Maintenance and Prevention measures were discussed with patient. Included topics are cancer screening recommendations, ways to keep healthy (see AVS) including dietary and exercise recommendations, regular eye and dental care, use of seat belts, and avoidance of moderate alcohol use and tobacco use. Refuses breast cancer and colon cancer screening. Education given.  ?BMI: discussed patient's BMI and encouraged positive lifestyle modifications to help get to or maintain a target BMI. ?HM needs and immunizations were addressed and ordered. See below for orders. See HM and immunization section for updates. ?Routine labs and screening tests ordered including cmp, cbc and lipids where appropriate. ?Discussed recommendations regarding Vit D and calcium supplementation (see AVS) ? ?Chronic disease management visit and/or acute problem visit: ?Low thyroid: recheck and then redose  levothyroxine.  ? ?Follow up: 6-8 weeks for thyroid recheck.   ?Orders Placed This Encounter  ?Procedures  ? CBC with Differential/Platelet  ? Lipid Profile  ? TSH  ? Comprehensive metabolic panel  ? ?No orders of the defined types were placed in this encounter. ? ?  ? ?Body mass index is 23.32 kg/m?. ?Wt Readings from Last 3 Encounters:  ?10/29/21 144 lb 8 oz (65.5 kg)  ?03/26/21 131 lb 3.2 oz (59.5 kg)  ?03/24/20 134 lb 6.4 oz (61 kg)  ? ? ? ?Patient Active Problem List  ? Diagnosis Date Noted  ? Situational insomnia 03/24/2020  ? Left breast lump - implant valve 12/13/2018  ? Acquired hypothyroidism 03/26/2014  ? Postmenopausal symptoms 03/26/2014  ? Seasonal allergies 03/26/2014  ? ?Health Maintenance  ?Topic Date Due  ? Zoster Vaccines- Shingrix (1 of 2) 01/28/2022 (Originally 08/01/2014)  ? MAMMOGRAM  10/30/2022 (Originally 05/06/2021)  ? Fecal DNA (Cologuard)  10/30/2022 (Originally 08/01/2009)  ? INFLUENZA VACCINE  02/01/2022  ? PAP SMEAR-Modifier  10/05/2022  ? Hepatitis C Screening  Completed  ? HIV Screening  Completed  ? HPV VACCINES  Aged Out  ? TETANUS/TDAP  Discontinued  ? ? ?There is no immunization history on file for this patient. ?We updated and reviewed the patient's past history in detail and it is documented below. ?Allergies: ?Patient has No Known Allergies. ?Past Medical History ?Patient  has a past medical history of Allergy, Anxiety, and Thyroid disease. ?Past Surgical History ?Patient  has a past surgical history that includes Cesarean section; Ovarian cyst removal; and Breast surgery. ?Family History: ?Patient family history includes Drug abuse in her daughter; Healthy  in her son. She was adopted. ?Social History:  ?Patient  reports that she has never smoked. She has never used smokeless tobacco. She reports current alcohol use. She reports that she does not use drugs. ? ?Review of Systems: ?Constitutional: negative for fever or malaise ?Ophthalmic: negative for photophobia, double vision or  loss of vision ?Cardiovascular: negative for chest pain, dyspnea on exertion, or new LE swelling ?Respiratory: negative for SOB or persistent cough ?Gastrointestinal: negative for abdominal pain, change in bowel habits or melena ?Genitourinary: negative for dysuria or gross hematuria, no abnormal uterine bleeding or disharge ?Musculoskeletal: negative for new gait disturbance or muscular weakness ?Integumentary: negative for new or persistent rashes, no breast lumps ?Neurological: negative for TIA or stroke symptoms ?Psychiatric: negative for SI or delusions ?Allergic/Immunologic: negative for hives ? ?Patient Care Team  ?  Relationship Specialty Notifications Start End  ?Willow Ora, MD PCP - General Family Medicine  08/29/17   ? ? ?Objective  ?Vitals: BP 100/64   Pulse 81   Temp 98.3 ?F (36.8 ?C)   Ht 5\' 6"  (1.676 m)   Wt 144 lb 8 oz (65.5 kg)   SpO2 94%   BMI 23.32 kg/m?  ?General:  Well developed, well nourished, no acute distress  ?Psych:  Alert and orientedx3,normal mood and affect ?HEENT:  Normocephalic, atraumatic, non-icteric sclera,  supple neck without adenopathy, mass or thyromegaly ?Cardiovascular:  Normal S1, S2, RRR without gallop, rub or murmur ?Respiratory:  Good breath sounds bilaterally, CTAB with normal respiratory effort ?Gastrointestinal: normal bowel sounds, soft, non-tender, no noted masses. No HSM ?MSK: no deformities, contusions. Joints are without erythema or swelling.  ?Skin:  Warm, no rashes or suspicious lesions noted ?Neurologic:    Mental status is normal. CN 2-11 are normal. Gross motor and sensory exams are normal. Normal gait. No tremor ? ? ?Commons side effects, risks, benefits, and alternatives for medications and treatment plan prescribed today were discussed, and the patient expressed understanding of the given instructions. Patient is instructed to call or message via MyChart if he/she has any questions or concerns regarding our treatment plan. No barriers to  understanding were identified. We discussed Red Flag symptoms and signs in detail. Patient expressed understanding regarding what to do in case of urgent or emergency type symptoms.  ?Medication list was reconciled, printed and provided to the patient in AVS. Patient instructions and summary information was reviewed with the patient as documented in the AVS. ?This note was prepared with assistance of . Occasional wrong-word or sound-a-like substitutions may have occurred due to the inherent limitations of voice recognition software ? ?This visit occurred during the SARS-CoV-2 public health emergency.  Safety protocols were in place, including screening questions prior to the visit, additional usage of staff PPE, and extensive cleaning of exam room while observing appropriate contact time as indicated for disinfecting solutions.  ? ? ? ?

## 2021-10-29 NOTE — Addendum Note (Signed)
Addended by: Asencion Partridge on: 10/29/2021 08:16 PM ? ? Modules accepted: Orders ? ?

## 2021-10-31 ENCOUNTER — Other Ambulatory Visit: Payer: Self-pay | Admitting: Family Medicine

## 2021-10-31 ENCOUNTER — Encounter: Payer: Self-pay | Admitting: Family Medicine

## 2021-11-01 ENCOUNTER — Other Ambulatory Visit: Payer: Self-pay

## 2021-11-01 DIAGNOSIS — E039 Hypothyroidism, unspecified: Secondary | ICD-10-CM

## 2021-11-01 MED ORDER — LEVOTHYROXINE SODIUM 100 MCG PO TABS: 100.0000 ug | ORAL_TABLET | ORAL | 0 refills | Status: DC

## 2021-12-20 ENCOUNTER — Other Ambulatory Visit (INDEPENDENT_AMBULATORY_CARE_PROVIDER_SITE_OTHER): Payer: Self-pay

## 2021-12-20 DIAGNOSIS — E039 Hypothyroidism, unspecified: Secondary | ICD-10-CM

## 2021-12-20 LAB — TSH: TSH: 3.95 u[IU]/mL (ref 0.35–5.50)

## 2022-03-16 ENCOUNTER — Other Ambulatory Visit: Payer: Self-pay | Admitting: Family Medicine

## 2022-03-28 ENCOUNTER — Encounter: Payer: Self-pay | Admitting: *Deleted

## 2022-04-12 ENCOUNTER — Other Ambulatory Visit: Payer: Self-pay | Admitting: Family Medicine

## 2022-04-25 ENCOUNTER — Other Ambulatory Visit: Payer: Self-pay | Admitting: Family Medicine

## 2022-06-16 ENCOUNTER — Encounter: Payer: Self-pay | Admitting: *Deleted

## 2022-07-04 HISTORY — PX: BELT ABDOMINOPLASTY: SHX1215

## 2022-09-01 IMAGING — MG MM  DIGITAL DIAGNOSTIC BREAST BILAT IMPLANT W/ TOMO W/ CAD
8 of 16 series · 8 of 40 positions shown · non-contrast
Comparison: Previous exam(s).

CLINICAL DATA: 54-year-old female presenting for annual exam and
progressive shifting of her left implant.

EXAM:
DIGITAL DIAGNOSTIC BILATERAL MAMMOGRAM WITH IMPLANTS, CAD AND TOMO
The patient has retropectoral implants. Standard and implant
displaced views were performed.

[R CC]
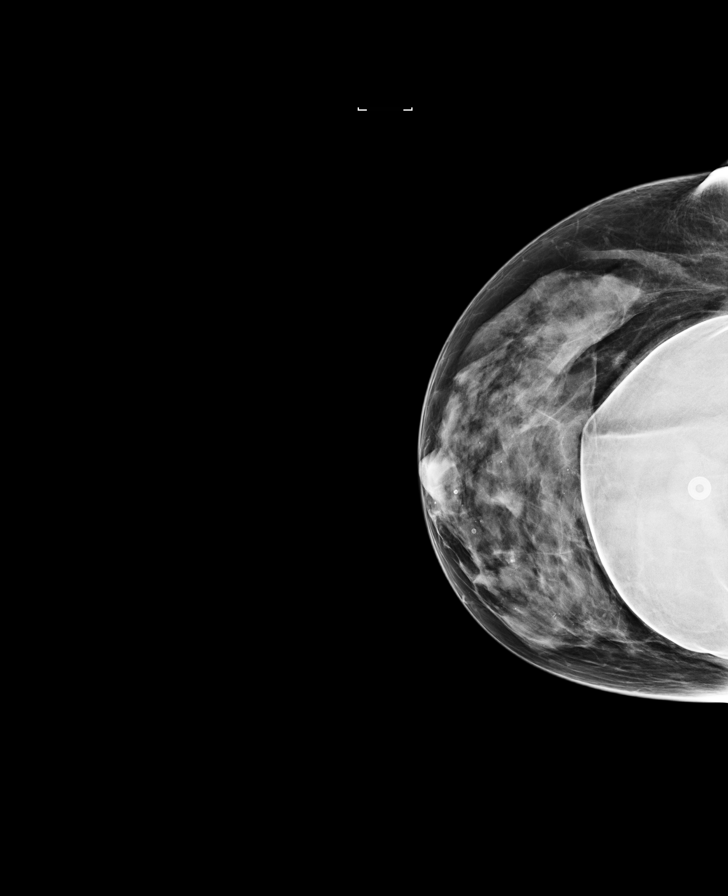

[L CC]
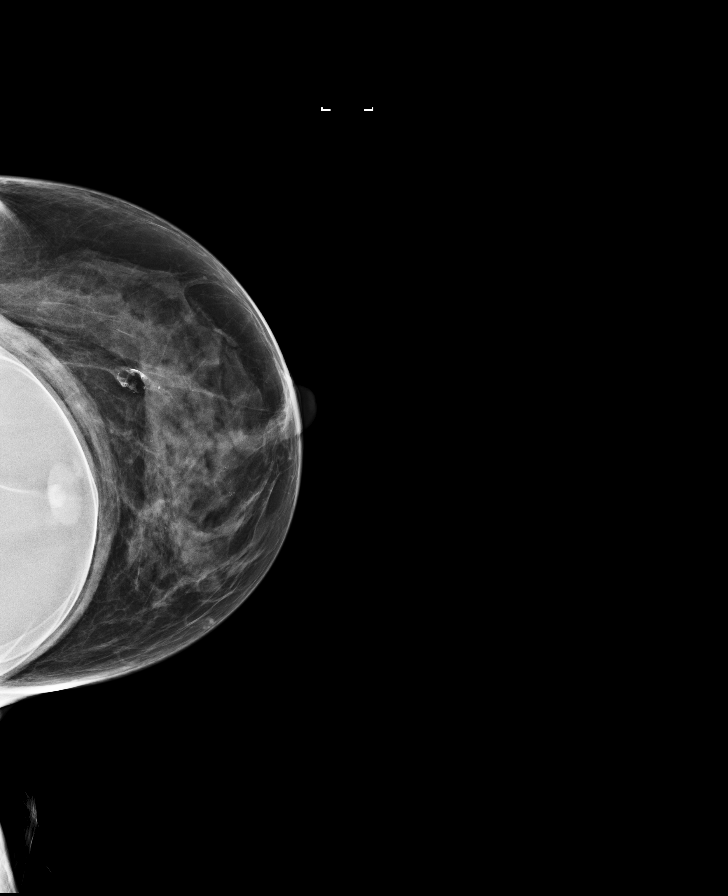

[L MLO]
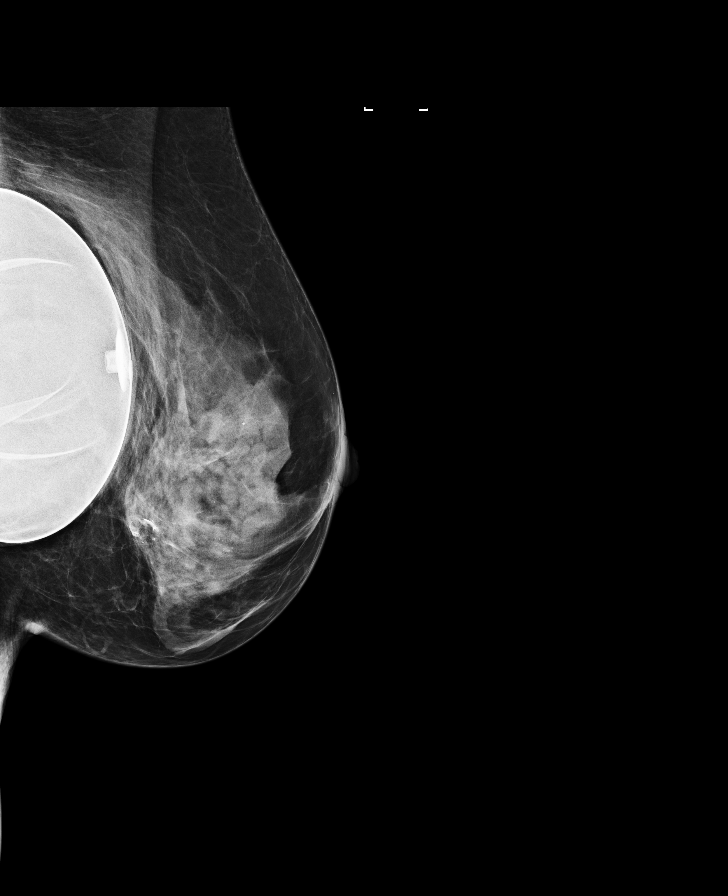

[R MLO]
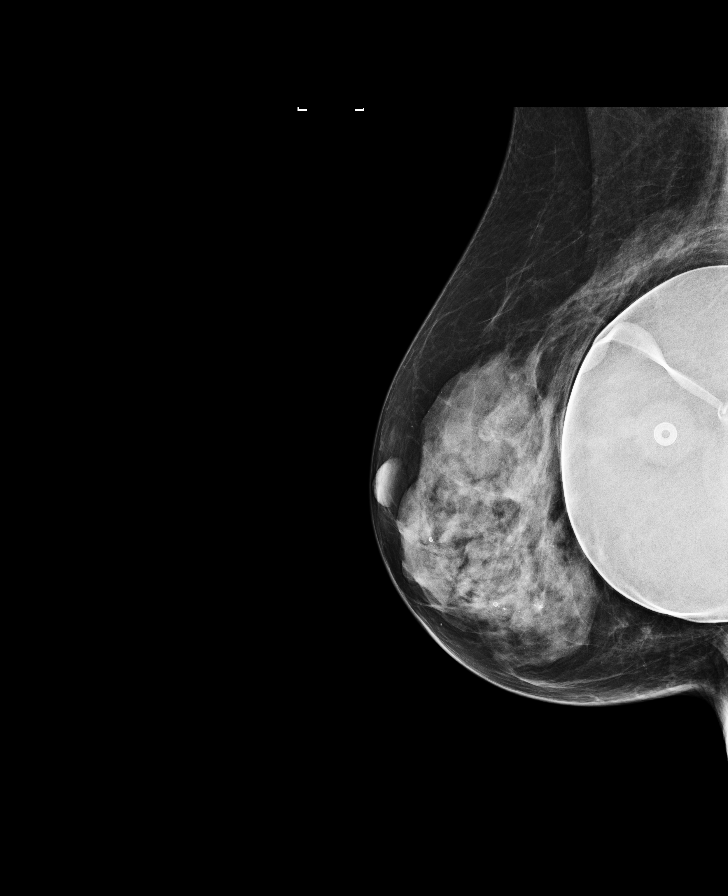

[R CC synth-2D]
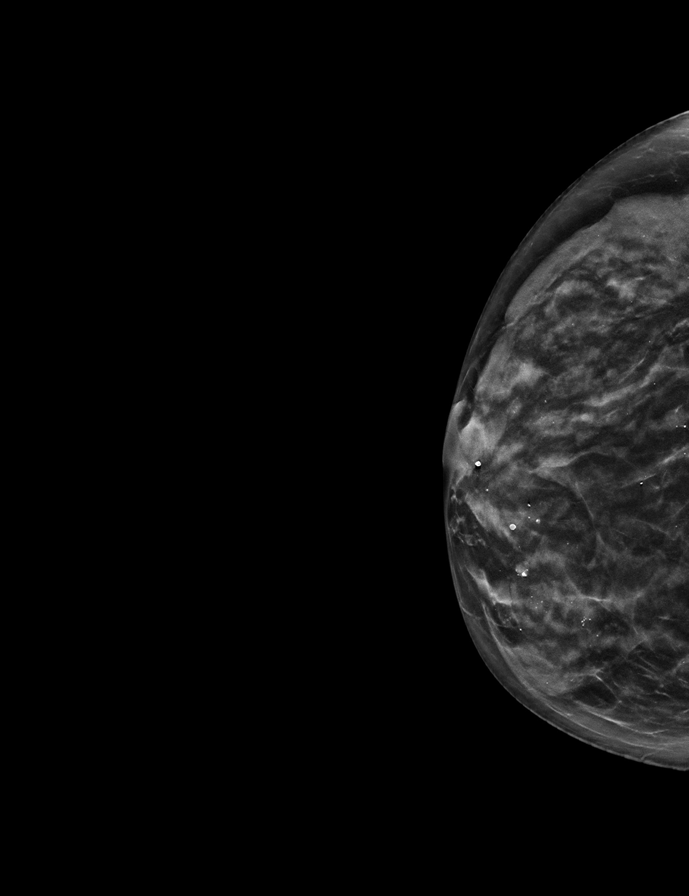

[L CC synth-2D]
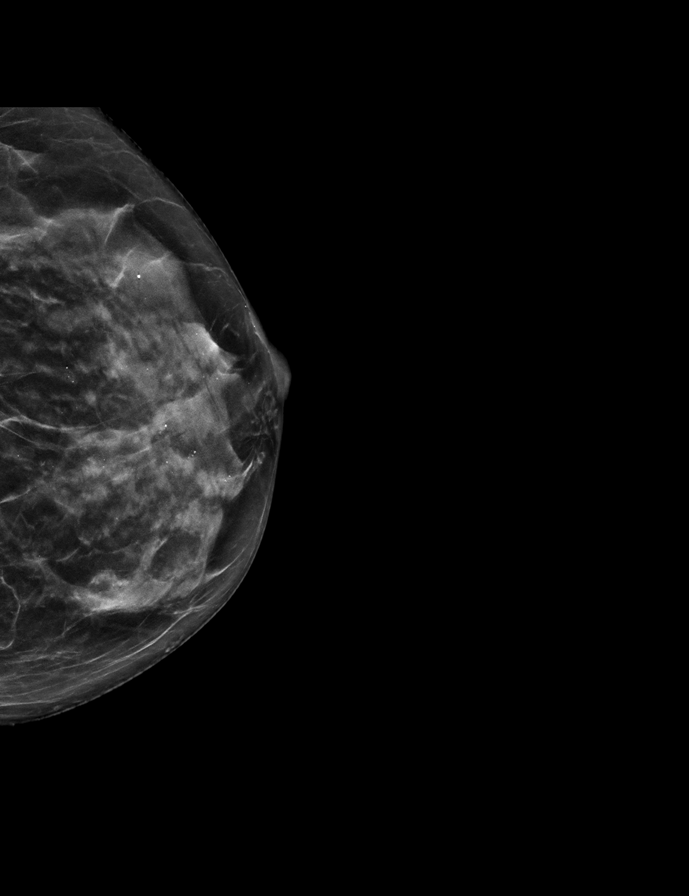

[R MLO synth-2D]
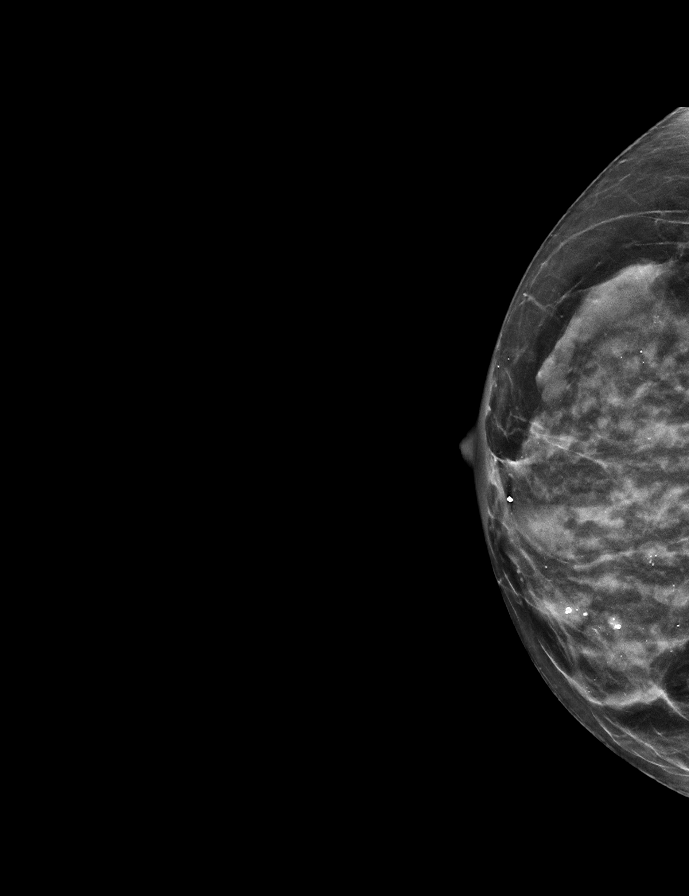

[L MLO synth-2D]
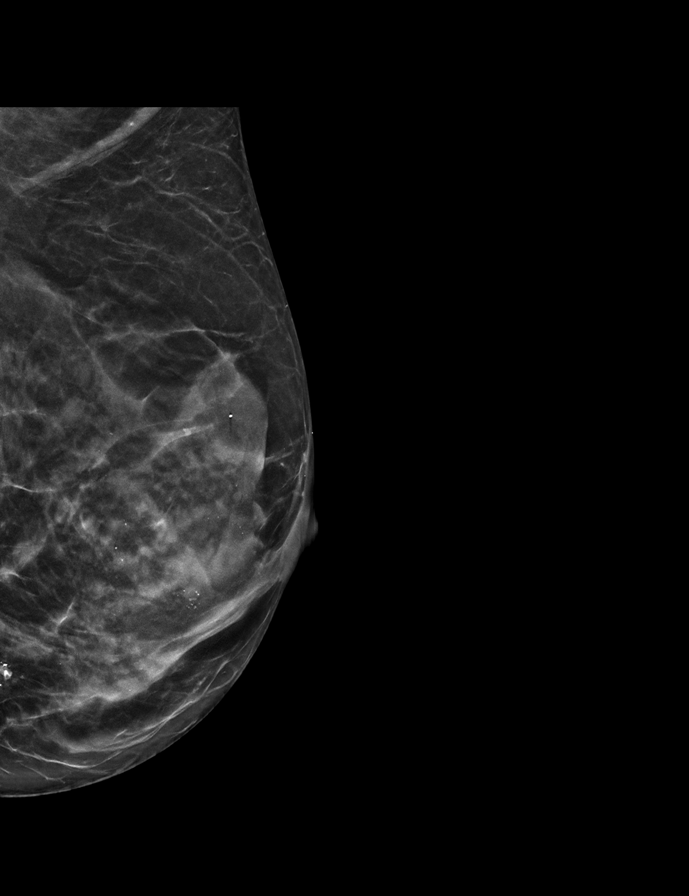

[8 of 40 positions shown; findings below may reference images not displayed]

ACR Breast Density Category d: The breast tissue is extremely dense,
which lowers the sensitivity of mammography.
FINDINGS: Right breast: No suspicious mass, distortion, or microcalcifications
are identified to suggest presence of malignancy.

Left breast: No suspicious mass, distortion, or microcalcifications
are identified to suggest presence of malignancy.

The bilateral implants appear grossly intact.

On physical exam there is superior displacement of the left implant
compared to the right.

Mammographic images were processed with CAD.
IMPRESSION: 1.  No mammographic evidence of malignancy in the bilateral breasts.

2. The patient has bilateral saline implants appear grossly intact.
On physical exam there does appear to be shifting of the left
implant.

RECOMMENDATION:
1.  Screening mammogram in one year.(Code:SX-7-LME)

2.  Consider surgical consultation for the left breast implant.

I have discussed the findings and recommendations with the patient.
If applicable, a reminder letter will be sent to the patient
regarding the next appointment.

BI-RADS CATEGORY  2: Benign.

## 2023-02-16 ENCOUNTER — Encounter (INDEPENDENT_AMBULATORY_CARE_PROVIDER_SITE_OTHER): Payer: Self-pay

## 2023-03-08 ENCOUNTER — Encounter: Payer: Self-pay | Admitting: Family Medicine

## 2023-04-24 ENCOUNTER — Other Ambulatory Visit: Payer: Self-pay | Admitting: Family Medicine

## 2023-05-04 ENCOUNTER — Encounter: Payer: Self-pay | Admitting: Family Medicine

## 2023-05-04 ENCOUNTER — Other Ambulatory Visit (HOSPITAL_COMMUNITY)
Admission: RE | Admit: 2023-05-04 | Discharge: 2023-05-04 | Disposition: A | Discharge status: Home / Self Care | Payer: Self-pay | Source: Ambulatory Visit | Attending: Family Medicine | Admitting: Family Medicine

## 2023-05-04 ENCOUNTER — Ambulatory Visit (INDEPENDENT_AMBULATORY_CARE_PROVIDER_SITE_OTHER): Payer: Self-pay | Admitting: Family Medicine

## 2023-05-04 VITALS — BP 128/78 | HR 81 | Temp 98.5°F | Ht 66.0 in | Wt 166.6 lb

## 2023-05-04 DIAGNOSIS — R6882 Decreased libido: Secondary | ICD-10-CM

## 2023-05-04 DIAGNOSIS — R350 Frequency of micturition: Secondary | ICD-10-CM

## 2023-05-04 DIAGNOSIS — B0089 Other herpesviral infection: Secondary | ICD-10-CM

## 2023-05-04 DIAGNOSIS — N959 Unspecified menopausal and perimenopausal disorder: Secondary | ICD-10-CM

## 2023-05-04 DIAGNOSIS — Z23 Encounter for immunization: Secondary | ICD-10-CM

## 2023-05-04 DIAGNOSIS — E039 Hypothyroidism, unspecified: Secondary | ICD-10-CM

## 2023-05-04 DIAGNOSIS — Z124 Encounter for screening for malignant neoplasm of cervix: Secondary | ICD-10-CM | POA: Insufficient documentation

## 2023-05-04 DIAGNOSIS — F5109 Other insomnia not due to a substance or known physiological condition: Secondary | ICD-10-CM

## 2023-05-04 DIAGNOSIS — Z13220 Encounter for screening for lipoid disorders: Secondary | ICD-10-CM

## 2023-05-04 DIAGNOSIS — N952 Postmenopausal atrophic vaginitis: Secondary | ICD-10-CM

## 2023-05-04 DIAGNOSIS — Z1211 Encounter for screening for malignant neoplasm of colon: Secondary | ICD-10-CM

## 2023-05-04 DIAGNOSIS — Z0001 Encounter for general adult medical examination with abnormal findings: Secondary | ICD-10-CM

## 2023-05-04 DIAGNOSIS — Z1231 Encounter for screening mammogram for malignant neoplasm of breast: Secondary | ICD-10-CM

## 2023-05-04 HISTORY — DX: Other herpesviral infection: B00.89

## 2023-05-04 LAB — CBC WITH DIFFERENTIAL/PLATELET
Basophils Absolute: 0.1 10*3/uL (ref 0.0–0.1)
Basophils Relative: 1.1 % (ref 0.0–3.0)
Eosinophils Absolute: 0.2 10*3/uL (ref 0.0–0.7)
Eosinophils Relative: 3.8 % (ref 0.0–5.0)
HCT: 40.7 % (ref 36.0–46.0)
Hemoglobin: 13.4 g/dL (ref 12.0–15.0)
Lymphocytes Relative: 28 % (ref 12.0–46.0)
Lymphs Abs: 1.3 10*3/uL (ref 0.7–4.0)
MCHC: 32.9 g/dL (ref 30.0–36.0)
MCV: 96 fL (ref 78.0–100.0)
Monocytes Absolute: 0.4 10*3/uL (ref 0.1–1.0)
Monocytes Relative: 9.3 % (ref 3.0–12.0)
Neutro Abs: 2.7 10*3/uL (ref 1.4–7.7)
Neutrophils Relative %: 57.8 % (ref 43.0–77.0)
Platelets: 263 10*3/uL (ref 150.0–400.0)
RBC: 4.24 Mil/uL (ref 3.87–5.11)
RDW: 12.6 % (ref 11.5–15.5)
WBC: 4.7 10*3/uL (ref 4.0–10.5)

## 2023-05-04 LAB — POCT URINALYSIS DIPSTICK
Bilirubin, UA: NEGATIVE
Blood, UA: NEGATIVE
Glucose, UA: NEGATIVE
Ketones, UA: NEGATIVE
Leukocytes, UA: NEGATIVE
Nitrite, UA: NEGATIVE
Protein, UA: NEGATIVE
Spec Grav, UA: 1.01 (ref 1.010–1.025)
Urobilinogen, UA: 0.2 U/dL — AB
pH, UA: 6.5 (ref 5.0–8.0)

## 2023-05-04 LAB — COMPREHENSIVE METABOLIC PANEL
ALT: 17 U/L (ref 0–35)
AST: 21 U/L (ref 0–37)
Albumin: 4.6 g/dL (ref 3.5–5.2)
Alkaline Phosphatase: 50 U/L (ref 39–117)
BUN: 8 mg/dL (ref 6–23)
CO2: 27 meq/L (ref 19–32)
Calcium: 9.9 mg/dL (ref 8.4–10.5)
Chloride: 103 meq/L (ref 96–112)
Creatinine, Ser: 0.68 mg/dL (ref 0.40–1.20)
GFR: 95.78 mL/min (ref >60.00)
Glucose, Bld: 100 mg/dL — ABNORMAL HIGH (ref 70–99)
Potassium: 4.7 meq/L (ref 3.5–5.1)
Sodium: 139 meq/L (ref 135–145)
Total Bilirubin: 0.5 mg/dL (ref 0.2–1.2)
Total Protein: 7.6 g/dL (ref 6.0–8.3)

## 2023-05-04 LAB — LIPID PANEL
Cholesterol: 223 mg/dL — ABNORMAL HIGH (ref 0–200)
HDL: 86 mg/dL (ref >39.00)
LDL Cholesterol: 122 mg/dL — ABNORMAL HIGH (ref 0–99)
NonHDL: 137.06
Total CHOL/HDL Ratio: 3
Triglycerides: 74 mg/dL (ref 0.0–149.0)
VLDL: 14.8 mg/dL (ref 0.0–40.0)

## 2023-05-04 LAB — TSH: TSH: 6.88 u[IU]/mL — ABNORMAL HIGH (ref 0.35–5.50)

## 2023-05-04 MED ORDER — ESTRADIOL 0.1 MG/GM VA CREA: TOPICAL_CREAM | VAGINAL | 5 refills | Status: AC

## 2023-05-04 NOTE — Progress Notes (Signed)
Subjective  Chief Complaint  Patient presents with   Annual Exam    Pt here for annual exam - would like pap smear. Pt aware colonoscopy due. Pt c/o of urinary sxs. Pt will schedule mammogram, would like referral for colonoscopy.    Anxiety   Thyroid Problem    Pt would like to discuss hormones and labs     HPI: Jill Ferguson is a 57 y.o. female who presents to [facility] [place] today for a Female Wellness Visit. She also has the concerns and/or needs as listed above in the chief complaint. These will be addressed in addition to the Health Maintenance Visit.  Last cpe 18 months ago Wellness Visit: annual visit with health maintenance review and exam  HM: due pap. Never has had CRC screen. Overdue for mammogram. Eligible for shingrix and flu vaccines. Chronic disease f/u and/or acute problem visit: (deemed necessary to be done in addition to the wellness visit): Hypothyroidism: feels that it is controlled. Over a year since last checked. Compliant with meds Menopause: c/o painful intercourse, vaginal dryness, low libido, skin changes, fatigue, mental fog. C/o urinary frequency w/o dysuria or incontinence.  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 Prn acyclovir. Inosmnia: uses unisom otc intermittently.   Assessment  1. Encounter for well adult exam with abnormal findings   2. Cervical cancer screening   3. Acquired hypothyroidism   4. Postmenopausal symptoms   5. Frequent urination   6. Atrophic vaginitis   7. Low libido   8. Screening for colorectal cancer   9. Need for shingles vaccine   10. Herpetic dermatitis   11. Situational insomnia   12. Screening mammogram for breast cancer      Plan  Female Wellness Visit: Age appropriate Health Maintenance and Prevention measures were discussed with patient. Included topics are cancer screening recommendations, ways to keep healthy (see AVS) including dietary and exercise recommendations, regular  eye and dental care, use of seat belts, and avoidance of moderate alcohol use and tobacco use. Refer for colonoscopy. Pap done today. Overdue for mammogram. Pt to schedule BMI: discussed patient's BMI and encouraged positive lifestyle modifications to help get to or maintain a target BMI. HM needs and immunizations were addressed and ordered. See below for orders. See HM and immunization section for updates. 1st shingrix today/ return in 6 mo for 2nd Routine labs and screening tests ordered including cmp, cbc and lipids where appropriate. Discussed recommendations regarding Vit D and calcium supplementation (see AVS)  Chronic disease management visit and/or acute problem visit: Hypothyroidism. Recheck levels. Clinically stable Postmenopausal sxs w/ symptomatic atrophic vaginitis: estrace vaginal cream. Pt to return to further address symptoms.  Insomnia is fairly controlled on unisom Continue acyclovir as needed for hsv outbreaks, cheek/face Urinary frequency: likely oab, r/o infection today. Start vag estrace; return for further eval/treatment options   Follow up: 12 mo for cpe  Orders Placed This Encounter  Procedures   MM DIGITAL SCREENING BILATERAL   Zoster Recombinant (Shingrix )   CBC with Differential/Platelet   Comprehensive metabolic panel   Lipid panel   TSH   Ambulatory referral to Gastroenterology   POCT Urinalysis Dipstick   Meds ordered this encounter  Medications   estradiol (ESTRACE) 0.1 MG/GM vaginal cream    Sig: Place 1 Applicatorful vaginally at bedtime for 7 days, THEN 1 Applicatorful 3 (three) times a week.    Dispense:  42.5 g    Refill:  5  Body mass index is 26.89 kg/m. 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 Uses  acyclovir.    Situational insomnia 03/24/2020    Left breast lump - implant valve 12/13/2018    Acquired hypothyroidism 03/26/2014    Postmenopausal symptoms 03/26/2014    Seasonal allergies 03/26/2014    Health Maintenance  Topic Date Due   Colonoscopy  Never done   MAMMOGRAM  05/06/2021   Cervical Cancer Screening (HPV/Pap Cotest)  10/05/2022   COVID-19 Vaccine (1 - 2023-24 season) 05/20/2023 (Originally 03/05/2023)   INFLUENZA VACCINE  10/02/2023 (Originally 02/02/2023)   Zoster Vaccines- Shingrix (2 of 2) 06/29/2023   DTaP/Tdap/Td (2 - Td or Tdap) 12/07/2032   Hepatitis C Screening  Completed   HIV Screening  Completed   HPV VACCINES  Aged Out   Immunization History  Administered Date(s) Administered   Tdap 12/08/2022   Zoster Recombinant(Shingrix) 05/04/2023   We updated and reviewed the patient's past history in detail and it is documented below. Allergies: Patient has No Known Allergies. Past Medical History Patient  has a past medical history of Allergy, Anxiety, Herpetic dermatitis (05/04/2023), and Thyroid disease. Past Surgical History Patient  has a past surgical history that includes Cesarean section; Ovarian cyst removal; Breast surgery; and Belt abdominoplasty (2024). Family History: Patient family history includes Drug abuse in her daughter; Healthy in her son. She was adopted. Social History:  Patient  reports that she has never smoked. She has never used smokeless tobacco. She reports current alcohol use. She reports that she does not use drugs.  Review of Systems: Constitutional: negative for fever or malaise Ophthalmic: negative for photophobia, double vision or loss of vision Cardiovascular: negative for chest pain, dyspnea on exertion, or new LE swelling Respiratory: negative for SOB or persistent cough Gastrointestinal: negative for abdominal pain, change in bowel habits or melena Genitourinary: negative for dysuria or gross hematuria, no abnormal  uterine bleeding or disharge Musculoskeletal: negative for new gait disturbance or muscular weakness Integumentary: negative for new or persistent rashes, no breast lumps Neurological: negative for TIA or stroke symptoms Psychiatric: negative for SI or delusions Allergic/Immunologic: negative for hives  Patient Care Team    Relationship Specialty Notifications Start End  Willow Ora, MD PCP - General Family Medicine  08/29/17     Objective  Vitals: BP 128/78   Pulse 81   Temp 98.5 F (36.9 C)   Ht 5\' 6"  (1.676 m)   Wt 166 lb 9.6 oz (75.6 kg)   SpO2 98%   BMI 26.89 kg/m  General:  Well developed, well nourished, no acute distress  Psych:  Alert and orientedx3,normal mood and affect HEENT:  Normocephalic, atraumatic, non-icteric sclera,  supple neck without adenopathy, mass or thyromegaly Cardiovascular:  Normal S1, S2, RRR without gallop, rub or murmur Respiratory:  Good breath sounds bilaterally, CTAB with normal respiratory effort Gastrointestinal: normal bowel sounds, soft, non-tender, no noted masses. No HSM MSK: extremities without edema, joints without erythema or swelling Neurologic:    Mental status is normal.  Gross motor and sensory exams are normal.  No tremor Pelvic Exam: Normal external genitalia, vaginal atrophy presetn, no vulvar or vaginal lesions present. Clear cervix w/o CMT. Bimanual exam reveals  a nontender fundus w/o masses, nl size. No adnexal masses present. No inguinal adenopathy. A PAP smear was performed.   Lab Results  Component Value Date   COLORU yellow 05/04/2023   CLARITYU clear 05/04/2023   GLUCOSEUR Negative 05/04/2023   BILIRUBINUR negative 05/04/2023   KETONESU negative 05/04/2023   SPECGRAV 1.010 05/04/2023   RBCUR negative 05/04/2023   PHUR 6.5 05/04/2023   PROTEINUR Negative 05/04/2023   UROBILINOGEN 0.2 (A) 05/04/2023   LEUKOCYTESUR Negative 05/04/2023    Commons side effects, risks, benefits, and alternatives for medications and  treatment plan prescribed today were discussed, and the patient expressed understanding of the given instructions. Patient is instructed to call or message via MyChart if he/she has any questions or concerns regarding our treatment plan. No barriers to understanding were identified. We discussed Red Flag symptoms and signs in detail. Patient expressed understanding regarding what to do in case of urgent or emergency type symptoms.  Medication list was reconciled, printed and provided to the patient in AVS. Patient instructions and summary information was reviewed with the patient as documented in the AVS. This note was prepared with assistance of Dragon voice recognition software. Occasional wrong-word or sound-a-like substitutions may have occurred due to the inherent limitations of voice recognition software

## 2023-05-04 NOTE — Patient Instructions (Addendum)
Please return to discuss your concerns regarding your hormones and bladder symptoms.   I will release your lab results to you on your MyChart account with further instructions. You may see the results before I do, but when I review them I will send you a message with my report or have my assistant call you if things need to be discussed. Please reply to my message with any questions. Thank you!   You may start the estrogen cream as directed.   If you have any questions or concerns, please don't hesitate to send me a message via MyChart or call the office at [phone]. Thank you for visiting with [place] today! It's our pleasure caring for you.   We will call you with information regarding your referral appointment. Gastroenterology for colonoscopy consultation.  If you do not hear from [place] within the next 2 weeks, please let me know. It can take 1-2 weeks to get appointments set up with the specialists.    Please call the office checked below to schedule your appointment for your mammogram and/or bone density screen (the checked studies were ordered): [x]   Mammogram  []   Bone Density  [x]   [facility]     [address] [place], [place]        [phone]         []   [facility]  [address] [place], [place]  [phone]

## 2023-05-05 LAB — URINE CULTURE
MICRO NUMBER:: 15669983
Result:: NO GROWTH
SPECIMEN QUALITY:: ADEQUATE

## 2023-05-08 LAB — CYTOLOGY - PAP
Comment: NEGATIVE
Diagnosis: NEGATIVE
High risk HPV: NEGATIVE

## 2023-05-09 MED ORDER — LEVOTHYROXINE SODIUM 88 MCG PO TABS: 88.0000 ug | ORAL_TABLET | Freq: Every day | ORAL | 3 refills | Status: DC

## 2023-05-09 MED ORDER — LEVOTHYROXINE SODIUM 100 MCG PO TABS: 100.0000 ug | ORAL_TABLET | Freq: Every day | ORAL | 3 refills | Status: DC

## 2023-05-09 NOTE — Progress Notes (Signed)
   Dear Ms. Studer, It was good seeing you. Your lab results are all back and look good overall. Your cholesterol and liver, kidney and blood counts are all normal.  Your thyroid function is just below where we want it again, so I recommend adjusting up your thyroid medication as follows: Take M-F and increase to taking the on Sat and Sundays. I have sent in new prescriptions for you with these instructions. We will need a follow up in 8 weeks to recheck your levels. At that time, we can address your other concerns and follow up on your hormone cream as well. Please call the office to schedule the appointment. Your urine is not infected.  Your pap smear was also normal and can be repeated in 5 years.  Sincerely, Dr. Mardelle Matte

## 2023-05-09 NOTE — Addendum Note (Signed)
Addended by: Asencion Partridge on: 05/09/2023 07:20 AM   Modules accepted: Orders

## 2023-05-12 ENCOUNTER — Encounter: Payer: Self-pay | Admitting: Family Medicine

## 2023-05-12 ENCOUNTER — Ambulatory Visit (INDEPENDENT_AMBULATORY_CARE_PROVIDER_SITE_OTHER): Payer: Self-pay | Admitting: Family Medicine

## 2023-05-12 VITALS — BP 104/70 | HR 79 | Temp 98.3°F | Ht 66.0 in | Wt 166.4 lb

## 2023-05-12 DIAGNOSIS — N952 Postmenopausal atrophic vaginitis: Secondary | ICD-10-CM

## 2023-05-12 DIAGNOSIS — F5109 Other insomnia not due to a substance or known physiological condition: Secondary | ICD-10-CM

## 2023-05-12 DIAGNOSIS — N959 Unspecified menopausal and perimenopausal disorder: Secondary | ICD-10-CM

## 2023-05-12 DIAGNOSIS — N393 Stress incontinence (female) (male): Secondary | ICD-10-CM | POA: Insufficient documentation

## 2023-05-12 DIAGNOSIS — R6882 Decreased libido: Secondary | ICD-10-CM

## 2023-05-12 DIAGNOSIS — E039 Hypothyroidism, unspecified: Secondary | ICD-10-CM

## 2023-05-12 DIAGNOSIS — N3281 Overactive bladder: Secondary | ICD-10-CM | POA: Insufficient documentation

## 2023-05-12 DIAGNOSIS — R350 Frequency of micturition: Secondary | ICD-10-CM

## 2023-05-12 MED ORDER — OXYBUTYNIN CHLORIDE ER 5 MG PO TB24: 5.0000 mg | ORAL_TABLET | Freq: Every day | ORAL | 11 refills | Status: DC

## 2023-05-12 MED ORDER — ESTRADIOL-NORETHINDRONE ACET 1-0.5 MG PO TABS: 1.0000 | ORAL_TABLET | Freq: Every day | ORAL | 11 refills | Status: DC

## 2023-05-12 NOTE — Progress Notes (Signed)
Subjective  CC:  Chief Complaint  Patient presents with   lab results    Pt stated that she had a physical last week and was advised to come in to discuss them along with other issues..Bladder concerns     HPI: Jill Ferguson is a 57 y.o. female who presents to the office today to address the problems listed above in the chief complaint. Discussed the use of AI scribe software for clinical note transcription with the patient, who gave verbal consent to proceed.  History of Present Illness   The patient, a self-reported middle-aged individual, presents with a constellation of symptoms that she attributes to age-related hormonal changes. The primary concerns include frequent urination, sleep disturbances, and a decline in libido.  The patient reports a long-standing issue with frequent urination, which has been particularly disruptive to her sleep. She describes waking up every two hours at night to urinate, with a significant volume each time. The patient denies incontinence but notes the need for a poise pad 24/7 due to leakage when performing activities such as lifting her grandchild, sneezing, or laughing.  Sleep disturbances have been a chronic issue for the patient, who reports a history of difficulty maintaining sleep. She has tried various over-the-counter sleep aids and prescription medications, including trazodone, but found that some exacerbated her restless leg syndrome.  The patient also reports a significant decline in libido, which she attributes to hormonal changes. She has been using a prescribed estradiol cream for vaginal dryness and pain with intercourse. However, she reports experiencing cramping, similar to menstrual cramps, since starting the cream.  The patient's thyroid levels were found to be off in recent lab results, despite regular medication use. She has been advised to adjust her medication dosage. The patient also has a history of thermal ablation, which has  resulted in a long absence of menstrual cycles.  The patient's daughter, who is seeing an alternative medicine practitioner, has suggested that the patient's issues may be deeper than generalized symptoms and has recommended a series of tests. The patient is considering this approach but is also open to traditional evidence-based treatments.  In summary, the patient presents with a complex set of symptoms, including frequent urination, sleep disturbances, and decreased libido, which she attributes to hormonal changes related to age. She is open to exploring both traditional and alternative treatment options.      Assessment  1. Postmenopausal symptoms   2. Urinary frequency   3. Situational insomnia   4. Acquired hypothyroidism   5. OAB (overactive bladder)   6. Atrophic vaginitis   7. Low libido   8. Primary stress urinary incontinence      Plan  Assessment and Plan    Menopausal Symptoms Symptoms include vaginal dryness, dyspareunia, urinary frequency, occasional hot flashes, and low libido. Currently using estradiol cream. Discussed HRT options, including risks and benefits. Decided to start Activella (estrogen and progesterone) for systemic relief. Discussed potential risks of HRT, including increased breast cancer risk and the importance of regular mammograms. - Prescribe Activella (estrogen and progesterone) 1 pill once daily - Discontinue estradiol cream after starting Activella - schedule ordered mammogram  Overactive Bladder and Stress Urinary Incontinence Frequent urination, especially nocturia, and stress incontinence with activities like sneezing and lifting. Discussed benefits of vaginal estrogen cream and Kegel exercises. Consider bladder training and medication to relax bladder wall. Discussed potential need for surgical interventions like bladder slings if symptoms persist. - Continue vaginal estrogen cream for a few more weeks -  Perform Kegel exercises - Consider  bladder training - Prescribe ditropan xl 5mg  daily  Insomnia Chronic poor sleep, exacerbated by frequent urination. Previous trazodone trials increased restless leg syndrome. Currently using OTC sleep aids. Discussed addressing overactive bladder to improve sleep quality. - Address overactive bladder to improve sleep quality  Hypothyroidism Thyroid levels are suboptimal despite regular medication use. No significant symptoms reported. Plan to adjust dosage by adding an extra dose on weekends and recheck thyroid levels in 6-8 weeks. - Adjust thyroid medication to include an extra dose on weekends - Recheck thyroid levels in 6-8 weeks  General Health Maintenance Emphasized importance of regular screenings and preventive care, especially with HRT. Discussed need for mammograms and colon cancer screening. - Order mammogram - Order colonoscopy - Schedule follow-up appointment for lab visit in 8 weeks to recheck thyroid levels  Follow-up - Follow-up appointment in 3 months to reassess symptoms and treatment efficacy.      Follow up: 3 mo for recheck 07/06/2023  No orders of the defined types were placed in this encounter.  Meds ordered this encounter  Medications   oxybutynin (DITROPAN XL) 5 MG 24 hr tablet    Sig: Take 1 tablet (5 mg total) by mouth at bedtime.    Dispense:  30 tablet    Refill:  11   estradiol-norethindrone (ACTIVELLA) 1-0.5 MG tablet    Sig: Take 1 tablet by mouth daily.    Dispense:  30 tablet    Refill:  11      I reviewed the patients updated PMH, FH, and SocHx.    Patient Active Problem List   Diagnosis Date Noted   OAB (overactive bladder) 05/12/2023   Primary stress urinary incontinence 05/12/2023   Herpetic dermatitis 05/04/2023   Situational insomnia 03/24/2020   Left breast lump - implant valve 12/13/2018   Acquired hypothyroidism 03/26/2014   Postmenopausal symptoms 03/26/2014   Seasonal allergies 03/26/2014   Current Meds  Medication Sig    acyclovir (ZOVIRAX) 200 MG capsule TAKE 2 CAPSULES BY MOUTH ONCE DAILY AS NEEDED   doxylamine, Sleep, (UNISOM) 25 MG tablet Take 25 mg by mouth at bedtime as needed.   estradiol (ESTRACE) 0.1 MG/GM vaginal cream Place 1 Applicatorful vaginally at bedtime for 7 days, THEN 1 Applicatorful 3 (three) times a week.   estradiol-norethindrone (ACTIVELLA) 1-0.5 MG tablet Take 1 tablet by mouth daily.   levothyroxine (SYNTHROID) 100 MCG tablet Take 1 tablet (100 mcg total) by mouth daily before breakfast. On Saturday and Sunday   levothyroxine (SYNTHROID) 88 MCG tablet Take 1 tablet (88 mcg total) by mouth daily before breakfast. Monday - Friday   oxybutynin (DITROPAN XL) 5 MG 24 hr tablet Take 1 tablet (5 mg total) by mouth at bedtime.    Allergies: Patient has No Known Allergies. Family History: Patient family history includes Drug abuse in her daughter; Healthy in her son. She was adopted. Social History:  Patient  reports that she has never smoked. She has never used smokeless tobacco. She reports current alcohol use. She reports that she does not use drugs.  Review of Systems: Constitutional: Negative for fever malaise or anorexia Cardiovascular: negative for chest pain Respiratory: negative for SOB or persistent cough Gastrointestinal: negative for abdominal pain  Objective  Vitals: BP 104/70   Pulse 79   Temp 98.3 F (36.8 C)   Ht 5\' 6"  (1.676 m)   Wt 166 lb 6.4 oz (75.5 kg)   SpO2 97%   BMI 26.86 kg/m  General:  no acute distress , A&Ox3 Lab Results  Component Value Date   TSH 6.88 (H) 05/04/2023     Commons side effects, risks, benefits, and alternatives for medications and treatment plan prescribed today were discussed, and the patient expressed understanding of the given instructions. Patient is instructed to call or message via MyChart if he/she has any questions or concerns regarding our treatment plan. No barriers to understanding were identified. We discussed Red Flag  symptoms and signs in detail. Patient expressed understanding regarding what to do in case of urgent or emergency type symptoms.  Medication list was reconciled, printed and provided to the patient in AVS. Patient instructions and summary information was reviewed with the patient as documented in the AVS. This note was prepared with assistance of Dragon voice recognition software. Occasional wrong-word or sound-a-like substitutions may have occurred due to the inherent limitations of voice recognition software

## 2023-05-12 NOTE — Patient Instructions (Signed)
Please return in 3 months for recheck HRT and mood  If you have any questions or concerns, please don't hesitate to send me a message via MyChart or call the office at [phone]. Thank you for visiting with [place] today! It's our pleasure caring for you.   VISIT SUMMARY:  During today's visit, we discussed your symptoms of frequent urination, sleep disturbances, and decreased libido, which you believe are related to age-related hormonal changes. We reviewed your current treatments and made adjustments to better manage your symptoms.  YOUR PLAN:  -MENOPAUSAL SYMPTOMS: Menopausal symptoms are changes that occur due to the decrease in hormone levels as you age. We have decided to start you on Activella, a combination of estrogen and progesterone, to help with systemic relief. You should discontinue the estradiol cream once you start Activella. We also discussed the importance of regular mammograms due to the potential risks of hormone replacement therapy (HRT).  -OVERACTIVE BLADDER AND STRESS URINARY INCONTINENCE: Overactive bladder and stress urinary incontinence involve frequent urination and leakage during activities like sneezing or lifting. We recommend continuing the vaginal estrogen cream for a few more weeks, performing Kegel exercises, considering bladder training, and starting a medication to relax the bladder wall. If symptoms persist, surgical options may be considered.  -INSOMNIA: Insomnia is difficulty in falling or staying asleep. We believe that addressing your overactive bladder will help improve your sleep quality.  -HYPOTHYROIDISM: Hypothyroidism is when your thyroid gland does not produce enough thyroid hormone. We will adjust your medication to include an extra dose on weekends and recheck your thyroid levels in 6-8 weeks.  -GENERAL HEALTH MAINTENANCE: Regular screenings and preventive care are important, especially when on hormone replacement therapy. We have ordered a mammogram  and a colonoscopy for you. Please schedule a follow-up appointment in 8 weeks for lab tests to recheck your thyroid levels.  INSTRUCTIONS:  Please schedule a follow-up appointment in 3 months to reassess your symptoms and the effectiveness of the treatment. Additionally, schedule a lab visit in 8 weeks to recheck your thyroid levels.

## 2023-05-30 LAB — HM MAMMOGRAPHY

## 2023-06-06 ENCOUNTER — Encounter: Payer: Self-pay | Admitting: Family Medicine

## 2023-06-15 ENCOUNTER — Encounter: Payer: Self-pay | Admitting: Gastroenterology

## 2023-07-03 ENCOUNTER — Ambulatory Visit (INDEPENDENT_AMBULATORY_CARE_PROVIDER_SITE_OTHER): Payer: Self-pay | Admitting: Family Medicine

## 2023-07-03 ENCOUNTER — Encounter: Payer: Self-pay | Admitting: Family Medicine

## 2023-07-03 VITALS — BP 132/86 | HR 97 | Temp 98.6°F | Ht 66.0 in | Wt 158.8 lb

## 2023-07-03 DIAGNOSIS — M545 Low back pain, unspecified: Secondary | ICD-10-CM

## 2023-07-03 DIAGNOSIS — E039 Hypothyroidism, unspecified: Secondary | ICD-10-CM

## 2023-07-03 LAB — TSH: TSH: 5.18 u[IU]/mL (ref 0.35–5.50)

## 2023-07-03 MED ORDER — CYCLOBENZAPRINE HCL 10 MG PO TABS: 10.0000 mg | ORAL_TABLET | Freq: Every evening | ORAL | 0 refills | Status: DC | PRN

## 2023-07-03 MED ORDER — PREDNISONE 10 MG PO TABS: ORAL_TABLET | ORAL | 0 refills | Status: DC

## 2023-07-03 MED ORDER — LEVOTHYROXINE SODIUM 100 MCG PO TABS: ORAL_TABLET | ORAL | Status: DC

## 2023-07-03 MED ORDER — LEVOTHYROXINE SODIUM 88 MCG PO TABS: ORAL_TABLET | ORAL | 3 refills | Status: DC

## 2023-07-03 NOTE — Progress Notes (Signed)
Subjective  CC:  Chief Complaint  Patient presents with   Hip Pain    Pt stated that she has been having some Rt hip pain which started up yesterday.    HPI: Jill Ferguson is a 57 y.o. female who presents to the office today to address the problems listed above in the chief complaint. Discussed the use of AI scribe software for clinical note transcription with the patient, who gave verbal consent to proceed.  History of Present Illness   The patient, with a history of chronic sciatic pain, presents with a recent exacerbation of symptoms. She describes a long-standing issue with sciatic pain that has been managed with chiropractic care and occasional ibuprofen use. The pain is typically triggered by bending, stooping, or lifting, and is described as a constant awareness rather than acute pain. However, the patient experienced a significant increase in pain severity yesterday, describing it as a 'gripping, stabbing' sensation that caused muscle and nerve lockup, stopping her in her tracks. This severe pain occurred approximately 10-15 times throughout the day, even during mundane activities like walking or pushing a shopping cart. The patient also notes that the pain seems to originate internally and radiates down her leg, particularly when she is bending or stooping. She has previously undergone a vaginal sonogram to rule out internal causes for the pain. The patient also mentions joining a gym with the intention of taking yoga classes, but is concerned that the bending and stooping involved may exacerbate her pain.   We have not evaluated this back pain for her in the past. No bowel or bladder concerns. Normal leg strength. Pain shoots to back of buttocks and upper thigh. She is comfortable now. No leg weakness.  F/u hypothyroidism: last tsh was high and I adjusted up the dose of levothyroxine: no on 100mg c m-f and on sat sun. She is compliant. Feels fine.     Assessment  1. Low back  pain potentially associated with radiculopathy   2. Acquired hypothyroidism      Plan  Assessment and Plan    Chronic low back pain with acute flare Chronic sciatic nerve pain exacerbated by activities such as bending, stooping, and lifting. Recent severe episode with gripping, stabbing pain in the buttock, triggered by normal activities like walking and pushing a cart. Pain does not radiate down the leg. Previous chiropractic interventions and self-administered ibuprofen have provided limited relief. No prior imaging or muscle relaxers prescribed. Differential includes nerve irritation, muscle spasm, and possible arthritis. Discussed benefits of anti-inflammatories and muscle relaxers for managing inflammation and spasms. Explained that physical therapy and core strengthening exercises can improve symptoms and prevent future episodes. Informed about potential need for further imaging and specialist referral if symptoms persist. - Prescribe prednisone - Prescribe muscle relaxer for nighttime use - Provide stretching exercises in after-visit summary and MyChart - Order lumbar spine x-rays - Refer to physical therapy for core strengthening if not improving.  Hypothyroidism Hypothyroidism with recent dose adjustment. Requires follow-up lab work to assess current thyroid function. - Order thyroid function tests - Cancel upcoming lab appointment if tests are done today  General Health Maintenance Joined the [facility] and plans to start yoga for core strengthening and stretching, beneficial for low back pain. Encouraged to determine tolerance levels and proceed cautiously. - Encourage participation in yoga and core strengthening exercises - Provide anticipatory guidance on managing low back pain with exercise  Follow-up - Follow up in two weeks if symptoms do not improve -  Review x-ray results in one week.        Orders Placed This Encounter  Procedures   DG Lumbar Spine Complete   TSH    Meds ordered this encounter  Medications   levothyroxine (SYNTHROID) 100 MCG tablet    Sig: Take one tablet daily on Saturdays and Sundays   levothyroxine (SYNTHROID) 88 MCG tablet    Sig: Take daily Monday thru Friday    Dispense:  90 tablet    Refill:  3   predniSONE (DELTASONE) 10 MG tablet    Sig: Take 4 tabs qd x 2 days, 3 qd x 2 days, 2 qd x 2d, 1qd x 3 days    Dispense:  21 tablet    Refill:  0   cyclobenzaprine (FLEXERIL) 10 MG tablet    Sig: Take 1 tablet (10 mg total) by mouth at bedtime as needed for muscle spasms.    Dispense:  30 tablet    Refill:  0     I reviewed the patients updated PMH, FH, and SocHx.    Patient Active Problem List   Diagnosis Date Noted   OAB (overactive bladder) 05/12/2023   Primary stress urinary incontinence 05/12/2023   Herpetic dermatitis 05/04/2023   Situational insomnia 03/24/2020   Left breast lump - implant valve 12/13/2018   Acquired hypothyroidism 03/26/2014   Postmenopausal symptoms 03/26/2014   Seasonal allergies 03/26/2014   Current Meds  Medication Sig   acyclovir (ZOVIRAX) 200 MG capsule TAKE 2 CAPSULES BY MOUTH ONCE DAILY AS NEEDED   cyclobenzaprine (FLEXERIL) 10 MG tablet Take 1 tablet (10 mg total) by mouth at bedtime as needed for muscle spasms.   doxylamine, Sleep, (UNISOM) 25 MG tablet Take 25 mg by mouth at bedtime as needed.   estradiol (ESTRACE) 0.1 MG/GM vaginal cream Place 1 Applicatorful vaginally at bedtime for 7 days, THEN 1 Applicatorful 3 (three) times a week.   estradiol-norethindrone (ACTIVELLA) 1-0.5 MG tablet Take 1 tablet by mouth daily.   oxybutynin (DITROPAN XL) 5 MG 24 hr tablet Take 1 tablet (5 mg total) by mouth at bedtime.   predniSONE (DELTASONE) 10 MG tablet Take 4 tabs qd x 2 days, 3 qd x 2 days, 2 qd x 2d, 1qd x 3 days   [DISCONTINUED] levothyroxine (SYNTHROID) 100 MCG tablet Take 1 tablet (100 mcg total) by mouth daily before breakfast. On Saturday and Sunday   [DISCONTINUED]  levothyroxine (SYNTHROID) 88 MCG tablet Take 1 tablet (88 mcg total) by mouth daily before breakfast. Monday - Friday    Allergies: Patient has no known allergies. Family History: Patient family history includes Drug abuse in her daughter; Healthy in her son. She was adopted. Social History:  Patient  reports that she has never smoked. She has never used smokeless tobacco. She reports current alcohol use. She reports that she does not use drugs.  Review of Systems: Constitutional: Negative for fever malaise or anorexia Cardiovascular: negative for chest pain Respiratory: negative for SOB or persistent cough Gastrointestinal: negative for abdominal pain  Objective  Vitals: BP 132/86   Pulse 97   Temp 98.6 F (37 C)   Ht 5\' 6"  (1.676 m)   Wt 158 lb 12.8 oz (72 kg)   SpO2 95%   BMI 25.63 kg/m  General: no acute distress , A&Ox3 Back: nontender. No spasm. FROM, pain with extension. Neg SLR bilaterally. Normal gait  Commons side effects, risks, benefits, and alternatives for medications and treatment plan prescribed today were discussed, and the  patient expressed understanding of the given instructions. Patient is instructed to call or message via MyChart if he/she has any questions or concerns regarding our treatment plan. No barriers to understanding were identified. We discussed Red Flag symptoms and signs in detail. Patient expressed understanding regarding what to do in case of urgent or emergency type symptoms.  Medication list was reconciled, printed and provided to the patient in AVS. Patient instructions and summary information was reviewed with the patient as documented in the AVS. This note was prepared with assistance of Dragon voice recognition software. Occasional wrong-word or sound-a-like substitutions may have occurred due to the inherent limitations of voice recognition software

## 2023-07-03 NOTE — Patient Instructions (Addendum)
Please follow up if symptoms do not improve or as needed.    Please go to our [facility] office to get your xrays done. You can walk in M-F between 8:30am- noon or 1pm - 5pm. Tell them you are there for xrays ordered by me. They will send me the results, then I will let you know the results with instructions.   Address: [address].  The Xray department is located in the basement.   VISIT SUMMARY:  During today's visit, we discussed your chronic sciatic pain, which has recently worsened, and your hypothyroidism management. We also talked about your general health maintenance and plans to start yoga for core strengthening and stretching.  YOUR PLAN:  -CHRONIC SCIATICA: Chronic sciatica is a long-term pain that affects the sciatic nerve, which runs from your lower back down your legs. Your recent severe pain episode may be due to nerve irritation, muscle spasm, or possible arthritis. We have prescribed prednisone and a muscle relaxer for nighttime use to help manage inflammation and spasms. Additionally, we recommend physical therapy for core strengthening and pain management. We have also ordered lumbar spine x-rays to further investigate the cause of your pain. Please follow the stretching exercises provided in your after-visit summary and MyChart.  -HYPOTHYROIDISM: Hypothyroidism is a condition where your thyroid gland does not produce enough thyroid hormone. We have ordered thyroid function tests to assess your current thyroid levels. If these tests are done today, we will cancel your upcoming lab appointment.  -GENERAL HEALTH MAINTENANCE: You have joined the [facility] and plan to start yoga, which is beneficial for core strengthening and stretching, especially for managing low back pain. We encourage you to determine your tolerance levels and proceed cautiously with these exercises.  INSTRUCTIONS:  Please follow up in two weeks if your symptoms do not improve. We will review your  x-ray results in one week.  Back Exercises The following exercises strengthen the muscles that help to support the trunk (torso) and back. They also help to keep the lower back flexible. Doing these exercises can help to prevent or lessen existing low back pain. If you have back pain or discomfort, try doing these exercises 2-3 times each day or as told by your health care provider. As your pain improves, do them once each day, but increase the number of times that you repeat the steps for each exercise (do more repetitions). To prevent the recurrence of back pain, continue to do these exercises once each day or as told by your health care provider. Do exercises exactly as told by your health care provider and adjust them as directed. It is normal to feel mild stretching, pulling, tightness, or discomfort as you do these exercises, but you should stop right away if you feel sudden pain or your pain gets worse. Exercises Single knee to chest Repeat these steps 3-5 times for each leg: Lie on your back on a firm bed or the floor with your legs extended. Bring one knee to your chest. Your other leg should stay extended and in contact with the floor. Hold your knee in place by grabbing your knee or thigh with both hands and hold. Pull on your knee until you feel a gentle stretch in your lower back or buttocks. Hold the stretch for 10-30 seconds. Slowly release and straighten your leg.  Pelvic tilt Repeat these steps 5-10 times: Lie on your back on a firm bed or the floor with your legs extended. [employer]  your knees so they are pointing toward the ceiling and your feet are flat on the floor. Tighten your lower abdominal muscles to press your lower back against the floor. This motion will tilt your pelvis so your tailbone points up toward the ceiling instead of pointing to your feet or the floor. With gentle tension and even breathing, hold this position for 5-10 seconds.  Cat-cow Repeat these steps  until your lower back becomes more flexible: Get into a hands-and-knees position on a firm bed or the floor. Keep your hands under your shoulders, and keep your knees under your hips. You may place padding under your knees for comfort. Let your head hang down toward your chest. Contract your abdominal muscles and point your tailbone toward the floor so your lower back becomes rounded like the back of a cat. Hold this position for 5 seconds. Slowly lift your head, let your abdominal muscles relax, and point your tailbone up toward the ceiling so your back forms a sagging arch like the back of a cow. Hold this position for 5 seconds.  Press-ups Repeat these steps 5-10 times: Lie on your abdomen (face-down) on a firm bed or the floor. Place your palms near your head, about shoulder-width apart. Keeping your back as relaxed as possible and keeping your hips on the floor, slowly straighten your arms to raise the top half of your body and lift your shoulders. Do not use your back muscles to raise your upper torso. You may adjust the placement of your hands to make yourself more comfortable. Hold this position for 5 seconds while you keep your back relaxed. Slowly return to lying flat on the floor.  Bridges Repeat these steps 10 times: Lie on your back on a firm bed or the floor. Bend your knees so they are pointing toward the ceiling and your feet are flat on the floor. Your arms should be flat at your sides, next to your body. Tighten your buttocks muscles and lift your buttocks off the floor until your waist is at almost the same height as your knees. You should feel the muscles working in your buttocks and the back of your thighs. If you do not feel these muscles, slide your feet 1-2 inches (2.5-5 cm) farther away from your buttocks. Hold this position for 3-5 seconds. Slowly lower your hips to the starting position, and allow your buttocks muscles to relax completely. If this exercise is too  easy, try doing it with your arms crossed over your chest. Abdominal crunches Repeat these steps 5-10 times: Lie on your back on a firm bed or the floor with your legs extended. Bend your knees so they are pointing toward the ceiling and your feet are flat on the floor. Cross your arms over your chest. Tip your chin slightly toward your chest without bending your neck. Tighten your abdominal muscles and slowly raise your torso high enough to lift your shoulder blades a tiny bit off the floor. Avoid raising your torso higher than that because it can put too much stress on your lower back and does not help to strengthen your abdominal muscles. Slowly return to your starting position.  Back lifts Repeat these steps 5-10 times: Lie on your abdomen (face-down) with your arms at your sides, and rest your forehead on the floor. Tighten the muscles in your legs and your buttocks. Slowly lift your chest off the floor while you keep your hips pressed to the floor. Keep the back of your head  in line with the curve in your back. Your eyes should be looking at the floor. Hold this position for 3-5 seconds. Slowly return to your starting position.  Contact a health care provider if: Your back pain or discomfort gets much worse when you do an exercise. Your worsening back pain or discomfort does not lessen within 2 hours after you exercise. If you have any of these problems, stop doing these exercises right away. Do not do them again unless your health care provider says that you can. Get help right away if: You develop sudden, severe back pain. If this happens, stop doing the exercises right away. Do not do them again unless your health care provider says that you can. This information is not intended to replace advice given to you by your health care provider. Make sure you discuss any questions you have with your health care provider. Document Revised: 07/24/2022 Document Reviewed: 09/02/2020 Elsevier  Patient Education  2024 [employer].

## 2023-07-06 ENCOUNTER — Ambulatory Visit: Payer: Self-pay

## 2023-07-10 ENCOUNTER — Encounter: Payer: Self-pay | Admitting: Family Medicine

## 2023-07-10 ENCOUNTER — Other Ambulatory Visit: Payer: Self-pay | Admitting: Family Medicine

## 2023-07-10 MED ORDER — LEVOTHYROXINE SODIUM 88 MCG PO TABS: 88.0000 ug | ORAL_TABLET | ORAL | Status: DC

## 2023-07-10 MED ORDER — LEVOTHYROXINE SODIUM 100 MCG PO TABS: 100.0000 ug | ORAL_TABLET | ORAL | Status: DC

## 2023-07-13 ENCOUNTER — Ambulatory Visit (INDEPENDENT_AMBULATORY_CARE_PROVIDER_SITE_OTHER): Payer: Self-pay

## 2023-07-13 DIAGNOSIS — Z23 Encounter for immunization: Secondary | ICD-10-CM

## 2023-07-13 NOTE — Progress Notes (Signed)
 Pt came in on the Nurse schedule to receive her 2nd shingles Vacc. Administered in the Left Deltoid area w/o any complaints.

## 2023-08-13 ENCOUNTER — Other Ambulatory Visit: Payer: Self-pay | Admitting: Family Medicine

## 2023-08-14 ENCOUNTER — Other Ambulatory Visit: Payer: Self-pay | Admitting: Family Medicine

## 2023-08-25 ENCOUNTER — Encounter: Payer: Self-pay | Admitting: Gastroenterology

## 2023-09-08 ENCOUNTER — Encounter: Payer: Self-pay | Admitting: Family Medicine

## 2023-09-12 LAB — LIPID PANEL
A1c: 5.7
Cholesterol: 228 — AB (ref 0–200)
EGFR (Non-African Amer.): 92
HDL: 82 — AB (ref 35–70)
LDL Cholesterol: 130
TSH: 8.86 — AB (ref 0.41–5.90)

## 2023-09-12 LAB — BASIC METABOLIC PANEL WITH GFR
Creatinine: 0.8 (ref 0.5–1.1)
Glucose: 100
Potassium: 4.7 meq/L (ref 3.5–5.1)
Sodium: 138 (ref 137–147)

## 2023-09-12 LAB — TSH: TSH: 8.86 — AB (ref 0.41–5.90)

## 2023-09-12 LAB — HEMOGLOBIN A1C: Hemoglobin A1C: 5.7

## 2023-09-12 LAB — HEPATIC FUNCTION PANEL
ALT: 13 U/L (ref 7–35)
AST: 19 (ref 13–35)

## 2023-09-12 LAB — COMPREHENSIVE METABOLIC PANEL WITH GFR: eGFR: 92

## 2023-10-03 ENCOUNTER — Other Ambulatory Visit: Payer: Self-pay | Admitting: Family Medicine

## 2023-10-19 ENCOUNTER — Encounter: Payer: Self-pay | Admitting: Family Medicine

## 2023-11-09 ENCOUNTER — Ambulatory Visit (INDEPENDENT_AMBULATORY_CARE_PROVIDER_SITE_OTHER): Payer: Self-pay | Admitting: Family Medicine

## 2023-11-09 ENCOUNTER — Encounter: Payer: Self-pay | Admitting: Family Medicine

## 2023-11-09 VITALS — BP 130/83 | HR 78 | Temp 97.7°F | Ht 66.0 in | Wt 154.8 lb

## 2023-11-09 DIAGNOSIS — E039 Hypothyroidism, unspecified: Secondary | ICD-10-CM

## 2023-11-09 DIAGNOSIS — E663 Overweight: Secondary | ICD-10-CM

## 2023-11-09 DIAGNOSIS — R7303 Prediabetes: Secondary | ICD-10-CM | POA: Insufficient documentation

## 2023-11-09 LAB — TSH: TSH: 1.85 u[IU]/mL (ref 0.35–5.50)

## 2023-11-09 MED ORDER — LEVOTHYROXINE SODIUM 100 MCG PO TABS: 100.0000 ug | ORAL_TABLET | ORAL | Status: DC

## 2023-11-09 MED ORDER — LEVOTHYROXINE SODIUM 88 MCG PO TABS: 88.0000 ug | ORAL_TABLET | ORAL | Status: DC

## 2023-11-09 NOTE — Progress Notes (Signed)
 Subjective  CC:  Chief Complaint  Patient presents with   Hypothyroidism    HPI: Jill Ferguson is a 58 y.o. female who presents to the office today to address the problems listed above in the chief complaint. Discussed the use of AI scribe software for clinical note transcription with the patient, who gave verbal consent to proceed.  History of Present Illness Jill Ferguson is a 58 year old female with hypothyroidism who presents for follow-up of thyroid  medication management.  She has been managing her hypothyroidism with levothyroxine , alternating between 100 mcg and 88 mcg doses. She takes 100 mcg four times a week and 88 mcg three times a week, but has been alternating daily, which may have led to a slightly lower total weekly dose than intended. Her recent TSH level was elevated at 8.  She started tirzepatide five weeks ago after her last lab work and has tolerated it well. She has increased her exercise routine to include 30 to 45 minutes of cardio almost daily, which she started approximately six weeks before beginning tirzepatide. She joined a gym and has been engaging in activities like yoga.  She experiences a 'weird buzzing feeling' in her lower abdomen, described as similar to a cell phone vibrating. This sensation is localized between her belly button and pelvic area and is not associated with pain. No correlation with caffeine or anxiety has been noted, and she is unsure if it is related to her new medication.  She has a history of weight fluctuations, which she attributes to hormonal and metabolic factors. She describes herself as a light eater and has been focusing on maintaining a healthy diet and exercise routine. Her A1c is slightly elevated at 5.7, indicating a predisposition to metabolic issues.  She has been using a pharmacy service called [facility] for her medications, which has recently experienced a delay in supplying her usual generic levothyroxine  due to a change in  manufacturer.   Assessment  1. Acquired hypothyroidism   2. Prediabetes   3. Overweight (BMI 25.0-29.9)      Plan  Assessment and Plan Assessment & Plan Hypothyroidism TSH elevated at 8. Alternating levothyroxine  doses may lower total weekly dose. Pharmacy supply issues with generic levothyroxine  may affect therapeutic levels. Discussed switching to Synthroid  for consistent dosing. - Recheck TSH levels with current labs. - Adjust levothyroxine  dosage based on new TSH results. - Consider switching to brand name Synthroid  for consistent dosing. - Discuss with pharmacy about availability of current generic levothyroxine  or alternatives. - Follow up in 6-8 weeks to reassess TSH levels and adjust medication as needed.  Prediabetes A1c at upper limit of normal. Tirzepatide may help lower A1c. Regular exercise and healthy diet advised. - Encourage regular exercise and a healthy diet to manage blood glucose levels. - Monitor A1c levels regularly. - Follow up in 8 weeks to reassess A1c levels and overall health.  Overweight: good results with compounded tirzepatide. Discussed protein intake and exercise.     Follow up: 8 weeks to recheck thyroid  Orders Placed This Encounter  Procedures   TSH   Meds ordered this encounter  Medications   levothyroxine  (SYNTHROID ) 88 MCG tablet    Sig: Take 1 tablet (88 mcg total) by mouth every Tuesday, Thursday, Saturday, and Sunday.   levothyroxine  (SYNTHROID ) 100 MCG tablet    Sig: Take 1 tablet (100 mcg total) by mouth every Monday, Wednesday, and Friday.     I reviewed the patients updated PMH, FH, and SocHx.  Patient  Active Problem List   Diagnosis Date Noted   Prediabetes 11/09/2023   OAB (overactive bladder) 05/12/2023   Primary stress urinary incontinence 05/12/2023   Herpetic dermatitis 05/04/2023   Situational insomnia 03/24/2020   Left breast lump - implant valve 12/13/2018   Acquired hypothyroidism 03/26/2014   Postmenopausal  symptoms 03/26/2014   Seasonal allergies 03/26/2014   Current Meds  Medication Sig   acyclovir (ZOVIRAX) 200 MG capsule TAKE 2 CAPSULES BY MOUTH ONCE DAILY AS NEEDED   cyclobenzaprine  (FLEXERIL ) 10 MG tablet TAKE 1 TABLET BY MOUTH AT BEDTIME AS NEEDED FOR MUSCLE SPASM   doxylamine, Sleep, (UNISOM) 25 MG tablet Take 25 mg by mouth at bedtime as needed.   estradiol -norethindrone  (ACTIVELLA) 1-0.5 MG tablet Take 1 tablet by mouth daily.   oxybutynin  (DITROPAN  XL) 5 MG 24 hr tablet Take 1 tablet (5 mg total) by mouth at bedtime.   tirzepatide (MOUNJARO) 2.5 MG/0.5ML Pen Inject 2.5 mg into the skin once a week.   [DISCONTINUED] levothyroxine  (SYNTHROID ) 100 MCG tablet Take 1 tablet (100 mcg total) by mouth every Monday, Wednesday, and Friday. Take one tablet daily on Saturdays and Sundays   [DISCONTINUED] levothyroxine  (SYNTHROID ) 88 MCG tablet Take 1 tablet (88 mcg total) by mouth every Tuesday, Thursday, Saturday, and Sunday. Take 88mcg daily Monday thru Friday   Allergies: Patient has no known allergies. Family History: Patient family history includes Drug abuse in her daughter; Healthy in her son. She was adopted. Social History:  Patient  reports that she has never smoked. She has never used smokeless tobacco. She reports current alcohol use. She reports that she does not use drugs.  Review of Systems: Constitutional: Negative for fever malaise or anorexia Cardiovascular: negative for chest pain Respiratory: negative for SOB or persistent cough Gastrointestinal: negative for abdominal pain  Objective  Vitals: BP 130/83   Pulse 78   Temp 97.7 F (36.5 C)   Ht 5\' 6"  (1.676 m)   Wt 154 lb 12.8 oz (70.2 kg)   SpO2 100%   BMI 24.99 kg/m  General: no acute distress , A&Ox3 Appears well Commons side effects, risks, benefits, and alternatives for medications and treatment plan prescribed today were discussed, and the patient expressed understanding of the given instructions. Patient is  instructed to call or message via MyChart if he/she has any questions or concerns regarding our treatment plan. No barriers to understanding were identified. We discussed Red Flag symptoms and signs in detail. Patient expressed understanding regarding what to do in case of urgent or emergency type symptoms.  Medication list was reconciled, printed and provided to the patient in AVS. Patient instructions and summary information was reviewed with the patient as documented in the AVS. This note was prepared with assistance of Dragon voice recognition software. Occasional wrong-word or sound-a-like substitutions may have occurred due to the inherent limitations of voice recognition software

## 2023-11-09 NOTE — Patient Instructions (Addendum)
 Please return in 8 weeks to recheck thyroid .   If you have any questions or concerns, please don't hesitate to send me a message via MyChart or call the office at [phone]. Thank you for visiting with us  today! It's our pleasure caring for you.    VISIT SUMMARY: Today, we discussed the management of your hypothyroidism and prediabetes. We reviewed your current medications, recent lab results, and lifestyle changes. We also addressed a new symptom you have been experiencing.  YOUR PLAN: -HYPOTHYROIDISM: Hypothyroidism is a condition where your thyroid  gland does not produce enough thyroid  hormone. Your recent TSH level was elevated, which suggests that your current levothyroxine  dosage may need adjustment. We discussed the possibility of switching to Synthroid  for more consistent dosing. We will recheck your TSH levels with current labs and adjust your medication based on the new results. Please follow up in 6-8 weeks to reassess your TSH levels and adjust your medication as needed.  -PREDIABETES: Prediabetes means your blood sugar levels are higher than normal but not high enough to be classified as diabetes. Your A1c level is slightly elevated at 5.7. We recommend continuing with regular exercise and maintaining a healthy diet to manage your blood glucose levels. Tirzepatide may help lower your A1c. We will monitor your A1c levels regularly and follow up in 8 weeks to reassess your levels and overall health.  INSTRUCTIONS: Please follow up in 6-8 weeks to reassess your TSH levels and adjust your medication as needed. Additionally, follow up in 8 weeks to reassess your A1c levels and overall health.                      Contains text generated by [employer].                                 Contains text generated by Abridge.

## 2023-11-11 ENCOUNTER — Encounter: Payer: Self-pay | Admitting: Family Medicine

## 2023-11-11 MED ORDER — LEVOTHYROXINE SODIUM 100 MCG PO TABS: 100.0000 ug | ORAL_TABLET | ORAL | Status: DC

## 2023-11-11 MED ORDER — LEVOTHYROXINE SODIUM 88 MCG PO TABS: 88.0000 ug | ORAL_TABLET | ORAL | Status: DC

## 2023-11-11 NOTE — Progress Notes (Signed)
 See mychart note Dear Ms. Theilen, As you can see, your TSH is now in the normal range. I will leave your medication as is: alternating the 100mcg and 88mcg tablets and recheck in 8 weeks in the office.  I can't explain the difference in your levels if you have been consistent in our dosing, unless the weight loss has decreased your needs which is possible.  I will see you again in 8 weeks for recheck.  Sincerely, Dr. Jonelle Neri

## 2023-11-11 NOTE — Addendum Note (Signed)
 Addended by: Karma Oz on: 11/11/2023 04:26 PM   Modules accepted: Orders

## 2023-11-21 MED ORDER — LEVOTHYROXINE SODIUM 100 MCG PO TABS: 100.0000 ug | ORAL_TABLET | ORAL | 3 refills | Status: AC

## 2023-11-21 MED ORDER — LEVOTHYROXINE SODIUM 88 MCG PO TABS: 88.0000 ug | ORAL_TABLET | ORAL | 3 refills | Status: AC

## 2023-12-13 ENCOUNTER — Other Ambulatory Visit: Payer: Self-pay | Admitting: Family

## 2023-12-13 MED ORDER — SCOPOLAMINE 1 MG/3DAYS TD PT72: 1.0000 | MEDICATED_PATCH | TRANSDERMAL | 0 refills | Status: DC

## 2023-12-28 ENCOUNTER — Other Ambulatory Visit: Payer: Self-pay | Admitting: Family Medicine

## 2024-05-06 ENCOUNTER — Telehealth: Payer: Self-pay | Admitting: Family Medicine

## 2024-05-06 NOTE — Telephone Encounter (Signed)
 LVM to confirm schedule changed to 05/23/24 at 8:30 am for CPE. Advised to call if date & time do not work, to call our office.

## 2024-05-07 ENCOUNTER — Encounter: Payer: Self-pay | Admitting: Family Medicine

## 2024-05-13 ENCOUNTER — Other Ambulatory Visit: Payer: Self-pay | Admitting: Family Medicine

## 2024-05-23 ENCOUNTER — Ambulatory Visit (INDEPENDENT_AMBULATORY_CARE_PROVIDER_SITE_OTHER): Payer: Self-pay | Admitting: Family Medicine

## 2024-05-23 ENCOUNTER — Encounter: Payer: Self-pay | Admitting: Family Medicine

## 2024-05-23 ENCOUNTER — Ambulatory Visit: Payer: Self-pay | Admitting: Family Medicine

## 2024-05-23 VITALS — BP 122/70 | HR 67 | Temp 98.2°F | Ht 66.0 in | Wt 145.2 lb

## 2024-05-23 DIAGNOSIS — Z79890 Hormone replacement therapy (postmenopausal): Secondary | ICD-10-CM

## 2024-05-23 DIAGNOSIS — Z1231 Encounter for screening mammogram for malignant neoplasm of breast: Secondary | ICD-10-CM

## 2024-05-23 DIAGNOSIS — E039 Hypothyroidism, unspecified: Secondary | ICD-10-CM

## 2024-05-23 DIAGNOSIS — R7303 Prediabetes: Secondary | ICD-10-CM

## 2024-05-23 DIAGNOSIS — Z0001 Encounter for general adult medical examination with abnormal findings: Secondary | ICD-10-CM

## 2024-05-23 DIAGNOSIS — Z13220 Encounter for screening for lipoid disorders: Secondary | ICD-10-CM

## 2024-05-23 DIAGNOSIS — J069 Acute upper respiratory infection, unspecified: Secondary | ICD-10-CM

## 2024-05-23 DIAGNOSIS — Z1212 Encounter for screening for malignant neoplasm of rectum: Secondary | ICD-10-CM

## 2024-05-23 LAB — TSH: TSH: 1.49 u[IU]/mL (ref 0.35–5.50)

## 2024-05-23 LAB — COMPREHENSIVE METABOLIC PANEL WITH GFR
ALT: 14 U/L (ref 0–35)
AST: 19 U/L (ref 0–37)
Albumin: 4.2 g/dL (ref 3.5–5.2)
Alkaline Phosphatase: 49 U/L (ref 39–117)
BUN: 7 mg/dL (ref 6–23)
CO2: 28 meq/L (ref 19–32)
Calcium: 9.3 mg/dL (ref 8.4–10.5)
Chloride: 104 meq/L (ref 96–112)
Creatinine, Ser: 0.7 mg/dL (ref 0.40–1.20)
GFR: 94.41 mL/min (ref >60.00)
Glucose, Bld: 105 mg/dL — ABNORMAL HIGH (ref 70–99)
Potassium: 4.8 meq/L (ref 3.5–5.1)
Sodium: 138 meq/L (ref 135–145)
Total Bilirubin: 0.4 mg/dL (ref 0.2–1.2)
Total Protein: 6.7 g/dL (ref 6.0–8.3)

## 2024-05-23 LAB — LIPID PANEL
Cholesterol: 189 mg/dL (ref 0–200)
HDL: 76.2 mg/dL (ref >39.00)
LDL Cholesterol: 98 mg/dL (ref 0–99)
NonHDL: 112.64
Total CHOL/HDL Ratio: 2
Triglycerides: 71 mg/dL (ref 0.0–149.0)
VLDL: 14.2 mg/dL (ref 0.0–40.0)

## 2024-05-23 LAB — CBC WITH DIFFERENTIAL/PLATELET
Basophils Absolute: 0 K/uL (ref 0.0–0.1)
Basophils Relative: 1.1 % (ref 0.0–3.0)
Eosinophils Absolute: 0.2 K/uL (ref 0.0–0.7)
Eosinophils Relative: 5.7 % — ABNORMAL HIGH (ref 0.0–5.0)
HCT: 39 % (ref 36.0–46.0)
Hemoglobin: 13.1 g/dL (ref 12.0–15.0)
Lymphocytes Relative: 24 % (ref 12.0–46.0)
Lymphs Abs: 1 K/uL (ref 0.7–4.0)
MCHC: 33.6 g/dL (ref 30.0–36.0)
MCV: 95 fl (ref 78.0–100.0)
Monocytes Absolute: 0.5 K/uL (ref 0.1–1.0)
Monocytes Relative: 11.6 % (ref 3.0–12.0)
Neutro Abs: 2.3 K/uL (ref 1.4–7.7)
Neutrophils Relative %: 57.6 % (ref 43.0–77.0)
Platelets: 196 K/uL (ref 150.0–400.0)
RBC: 4.11 Mil/uL (ref 3.87–5.11)
RDW: 12.5 % (ref 11.5–15.5)
WBC: 4 K/uL (ref 4.0–10.5)

## 2024-05-23 LAB — T3, FREE: T3, Free: 2.6 pg/mL (ref 2.3–4.2)

## 2024-05-23 LAB — T4, FREE: Free T4: 0.77 ng/dL (ref 0.60–1.60)

## 2024-05-23 LAB — HEMOGLOBIN A1C: Hgb A1c MFr Bld: 5.6 % (ref 4.6–6.5)

## 2024-05-23 MED ORDER — ESTRADIOL-NORETHINDRONE ACET 0.5-0.1 MG PO TABS: 1.0000 | ORAL_TABLET | Freq: Every day | ORAL | 3 refills | Status: AC

## 2024-05-23 NOTE — Progress Notes (Signed)
 See mychart note Dear Ms. Ryans, It was good seeing you. All or your lab results look good. No changes are needed at this time.  Sincerely, Dr. Jodie

## 2024-05-23 NOTE — Patient Instructions (Signed)
Please return in 12 months for your annual complete physical; please come fasting.   I will release your lab results to you on your MyChart account with further instructions. You may see the results before I do, but when I review them I will send you a message with my report or have my assistant call you if things need to be discussed. Please reply to my message with any questions. Thank you!   If you have any questions or concerns, please don't hesitate to send me a message via MyChart or call the office at [phone]. Thank you for visiting with us today! It's our pleasure caring for you.   I recommend the Cologuard test for your colon cancer screening that is due. I have ordered this test for you. The Cologuard company will soon contact you to verify your insurance, address etc. They will then send you the kit; follow the instructions in the kit and return the kit to [employer]. They will run the test and send the results to me. I will then give you the results. If this test is negative, we recommend repeating a colon cancer screening test in 3 years. If it is positive, I will refer you to a Gastroenterologist so you can get set up for the recommended colonoscopy.  Thank you!  

## 2024-05-23 NOTE — Progress Notes (Signed)
 Subjective  Chief Complaint  Patient presents with   Annual Exam    Pt here for Annual Exam and is currently fasting. Mammo/colon has not been scheduled yet    Hypothyroidism    HPI: Jill Ferguson is a 58 y.o. female who presents to [facility] [place] today for a Female Wellness Visit. She also has the concerns and/or needs as listed above in the chief complaint. These will be addressed in addition to the Health Maintenance Visit.   Wellness Visit: annual visit with health maintenance review and exam  HM: mammogram due this month. Pt does not have health insurance: colonoscopy is too costly. She is avg risk for CRC. Discussed cologuard again. Defers vaccinations for now. Feeling great overall. Used tirzepatide for weight loss; off meds x 6 weeks. Had tummy tuck. Feels wonderful.   Chronic disease f/u and/or acute problem visit: (deemed necessary to be done in addition to the wellness visit): Discussed the use of AI scribe software for clinical note transcription with the patient, who gave verbal consent to proceed.  History of Present Illness Jill Ferguson is a 58 year old female who presents for an annual physical exam.  Upper respiratory symptoms - Persistent cough and cold symptoms for the past three days - Onset was sudden - Cough disrupts sleep - No fever - Using over-the-counter medications for symptom management  Insomnia- chronic - Difficulty with sleep, ongoing despite use of magnesium - Cough further disrupts sleep  Nutritional supplement use - Uses vitamin C, zinc, and elderberry, especially during the COVID-19 pandemic, for immune support - Currently taking magnesium for muscle mass retention and sleep  Weight management - Lost 20-25 pounds while on tirzepatide, discontinued six weeks ago - Current weight is 145 pounds; goal weight is 135 pounds - Maintains weight through running, strength training, and attending [employer] classes  Hormone  therapy - On oral hormone replacement therapy activella. Sxs are controlled. - Interested in bioidentical hormones but concerned about cost due to lack of insurance  Thyroid  management- due for recheck. No sxs of high or low. Good compliance reported.  - Manages thyroid  condition with alternating dose of medication  H/o prediabetes: suspect resolution since use of glp1     Assessment  1. Encounter for well adult exam with abnormal findings   2. Acquired hypothyroidism   3. Prediabetes   4. Hormone replacement therapy (HRT)   5. Screening for colorectal cancer   6. Screening mammogram for breast cancer   7. Viral upper respiratory tract infection      Plan  Female Wellness Visit: Age appropriate Health Maintenance and Prevention measures were discussed with patient. Included topics are cancer screening recommendations, ways to keep healthy (see AVS) including dietary and exercise recommendations, regular eye and dental care, use of seat belts, and avoidance of moderate alcohol use and tobacco use.  BMI: discussed patient's BMI and encouraged positive lifestyle modifications to help get to or maintain a target BMI. HM needs and immunizations were addressed and ordered. See below for orders. See HM and immunization section for updates. Routine labs and screening tests ordered including cmp, cbc and lipids where appropriate. Discussed recommendations regarding Vit D and calcium supplementation (see AVS)  Chronic disease management visit and/or acute problem visit: Assessment and Plan Assessment & Plan Adult Wellness Visit Routine adult wellness visit with discussion on general health maintenance, including exercise, nutrition, and wellness initiatives. - Ordered blood work to assess overall health status - Encouraged continuation of  exercise regimen, including strength training and body combat classes - Discussed potential benefits of GLP-1 agonists for metabolic  health  Hypothyroidism Managed with alternating doses of thyroid  medication. No symptoms of thyroid  dysfunction reported. - Ordered thyroid  function tests to assess current thyroid  status  Hormone replacement therapy for menopause Currently on hormone replacement therapy with birth control pills. Discussed potential benefits of bioidentical hormones and oral progesterone for sleep improvement. Cost and insurance considerations discussed. - Continue current hormone replacement therapy - Will consider oral progesterone for sleep improvement if cost is manageable  Prediabetes Discussed metabolic health and potential benefits of GLP-1 agonists for weight management and metabolic health. - Encouraged continuation of exercise and healthy diet - Discussed potential use of GLP-1 agonists for metabolic health  Acute upper respiratory infection with cough Acute upper respiratory infection with cough, no fever. Symptoms include difficulty sleeping due to coughing. Discussed symptomatic treatment options. - Recommended symptomatic treatment for cough - Discussed potential use of codeine-containing cough medicine for sleep  Screening for breast and colon cancer Due for mammogram and colon cancer screening. Discussed Cologuard as a cost-effective alternative to colonoscopy. She expressed concerns about cost and insurance coverage for colonoscopy. - Ordered mammogram - Recommended Cologuard for colon cancer screening    Follow up: 12 mo for cpe  Orders Placed This Encounter  Procedures   MM DIGITAL SCREENING BILATERAL   CBC with Differential/Platelet   Comprehensive metabolic panel with GFR   Lipid panel   Hemoglobin A1c   TSH   T3, free   T4, free   Cologuard   Meds ordered this encounter  Medications   Estradiol -Norethindrone  Acet 0.5-0.1 MG tablet    Sig: Take 1 tablet by mouth daily.    Dispense:  90 tablet    Refill:  3      Body mass index is 23.44 kg/m. 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 Uses acyclovir.    Situational insomnia 03/24/2020   Left breast lump - implant valve 12/13/2018   Acquired hypothyroidism 03/26/2014   Postmenopausal symptoms 03/26/2014   Seasonal allergies 03/26/2014   Health Maintenance  Topic Date Due   Hepatitis B Vaccines 19-59 Average Risk (1 of 3 - 19+ 3-dose series) Never done   Fecal DNA (Cologuard)  Never done   Mammogram  05/29/2024   COVID-19 Vaccine (1 - 2025-26 season) 06/08/2024 (Originally 03/04/2024)   Influenza Vaccine  10/01/2024 (Originally 02/02/2024)   Pneumococcal Vaccine: 50+ Years (1 of 1 - PCV) 05/23/2025 (Originally 08/01/2014)   Cervical Cancer Screening (HPV/Pap Cotest)  05/03/2028   DTaP/Tdap/Td (2 - Td or Tdap) 12/07/2032   Hepatitis C Screening  Completed   HIV Screening  Completed   Zoster Vaccines- Shingrix   Completed   HPV VACCINES  Aged Out   Meningococcal B Vaccine  Aged Out   Immunization History  Administered Date(s) Administered   Tdap 12/08/2022   Zoster Recombinant(Shingrix ) 05/04/2023, 07/13/2023   We updated and reviewed the patient's past history in detail and it is documented below. Allergies: Patient has no known allergies. Past Medical History Patient  has a past medical history of Allergy, Anxiety, Herpetic dermatitis (05/04/2023), and Thyroid  disease. Past Surgical History Patient  has a past surgical history that includes Cesarean section; Ovarian cyst removal; Breast surgery; and Belt abdominoplasty (2024). Family History: Patient family history includes Drug abuse in her daughter; Healthy in her  son. She was adopted. Social History:  Patient  reports that she has never smoked. She has never used smokeless tobacco. She reports current alcohol use. She reports that she does not use drugs.  Review of Systems: Constitutional: negative for fever or malaise Ophthalmic: negative for photophobia, double vision or loss of vision Cardiovascular: negative for chest pain, dyspnea on exertion, or new LE swelling Respiratory: negative for SOB or persistent cough Gastrointestinal: negative for abdominal pain, change in bowel habits or melena Genitourinary: negative for dysuria or gross hematuria, no abnormal uterine bleeding or disharge Musculoskeletal: negative for new gait disturbance or muscular weakness Integumentary: negative for new or persistent rashes, no breast lumps Neurological: negative for TIA or stroke symptoms Psychiatric: negative for SI or delusions Allergic/Immunologic: negative for hives  Patient Care Team    Relationship Specialty Notifications Start End  Jodie Lavern CROME, MD PCP - General Family Medicine  08/29/17     Objective  Vitals: BP 122/70   Pulse 67   Temp 98.2 F (36.8 C)   Ht 5' 6 (1.676 m)   Wt 145 lb 3.2 oz (65.9 kg)   SpO2 99%   BMI 23.44 kg/m  General:  Well developed, well nourished, no acute distress  Psych:  Alert and orientedx3,normal mood and affect HEENT:  Normocephalic, atraumatic, non-icteric sclera,  supple neck without adenopathy, mass or thyromegaly Cardiovascular:  Normal S1, S2, RRR without gallop, rub or murmur Respiratory:  Good breath sounds bilaterally, CTAB with normal respiratory effort Gastrointestinal: normal bowel sounds, soft, non-tender, no noted masses. No HSM MSK: extremities without edema, joints without erythema or swelling Neurologic:    Mental status is normal.  Gross motor and sensory exams are normal.  No tremor  Commons side effects, risks, benefits, and alternatives for medications and treatment plan prescribed  today were discussed, and the patient expressed understanding of the given instructions. Patient is instructed to call or message via MyChart if he/she has any questions or concerns regarding our treatment plan. No barriers to understanding were identified. We discussed Red Flag symptoms and signs in detail. Patient expressed understanding regarding what to do in case of urgent or emergency type symptoms.  Medication list was reconciled, printed and provided to the patient in AVS. Patient instructions and summary information was reviewed with the patient as documented in the AVS. This note was prepared with assistance of Dragon voice recognition software. Occasional wrong-word or sound-a-like substitutions may have occurred due to the inherent limitations of voice recognition software

## 2024-06-03 ENCOUNTER — Encounter: Payer: Self-pay | Admitting: Family Medicine

## 2024-06-04 LAB — HM MAMMOGRAPHY

## 2024-06-05 ENCOUNTER — Encounter: Payer: Self-pay | Admitting: Family Medicine

## 2024-06-18 LAB — COLOGUARD: COLOGUARD: NEGATIVE

## 2024-06-18 NOTE — Progress Notes (Signed)
Negative cologuard. Mc note sent

## 2024-06-24 LAB — HM MAMMOGRAPHY

## 2024-06-25 ENCOUNTER — Encounter: Payer: Self-pay | Admitting: Family Medicine
